# Patient Record
Sex: Male | Born: 1965 | Race: White | Hispanic: No | Smoking: Current every day smoker
Health system: Southern US, Community
[De-identification: ages and names within clinical notes are randomized; demographics above are authoritative.]

## PROBLEM LIST (undated history)

## (undated) DIAGNOSIS — R972 Elevated prostate specific antigen [PSA]: Secondary | ICD-10-CM

## (undated) DIAGNOSIS — I10 Essential (primary) hypertension: Secondary | ICD-10-CM

## (undated) DIAGNOSIS — C61 Malignant neoplasm of prostate: Secondary | ICD-10-CM

## (undated) DIAGNOSIS — Z8042 Family history of malignant neoplasm of prostate: Secondary | ICD-10-CM

## (undated) DIAGNOSIS — N2889 Other specified disorders of kidney and ureter: Secondary | ICD-10-CM

## (undated) DIAGNOSIS — E785 Hyperlipidemia, unspecified: Secondary | ICD-10-CM

## (undated) DIAGNOSIS — F419 Anxiety disorder, unspecified: Secondary | ICD-10-CM

## (undated) HISTORY — PX: PROSTATE BIOPSY: SHX241

## (undated) HISTORY — DX: Family history of malignant neoplasm of prostate: Z80.42

---
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
## 2015-03-14 DIAGNOSIS — C61 Malignant neoplasm of prostate: Secondary | ICD-10-CM

## 2015-03-14 HISTORY — DX: Malignant neoplasm of prostate: C61

## 2015-06-21 ENCOUNTER — Other Ambulatory Visit (HOSPITAL_COMMUNITY): Payer: Self-pay | Admitting: Urology

## 2015-06-21 ENCOUNTER — Telehealth: Payer: Self-pay | Admitting: Medical Oncology

## 2015-06-21 DIAGNOSIS — C61 Malignant neoplasm of prostate: Secondary | ICD-10-CM

## 2015-06-21 NOTE — Telephone Encounter (Signed)
Oncology Nurse Navigator Documentation  Oncology Nurse Navigator Flowsheets 06/21/2015  Navigator Location [facility]  Navigator Encounter Type Introductory phone call  Confirmed Diagnosis Date (No Data)  Acuity Level 1  Acuity Level 1 Initial guidance, education and coordination as needed  Time Spent with Patient 15   I called pt to introduce myself as the Prostate Nurse Navigator and the Coordinator of the [facility]state [facility].  1. I confirmed with the patient he is aware of his referral to the clinic April 14 th arriving 7:30am.   2. I discussed the format of the clinic and the physicians he will be seeing that day.  3. I discussed where the clinic is located and how to contact me.  4. I confirmed his address and informed him I would be mailing a packet of information and forms to be completed. He asked if it is possible to scan and e-mail the documents due to his mail being slow. I asked him to bring them with him the day of his appointment.   He voiced understanding of the above. I asked him to call me if he has any questions or concerns regarding his appointments or the forms he needs to complete.

## 2015-06-23 ENCOUNTER — Ambulatory Visit (HOSPITAL_COMMUNITY)
Admission: RE | Admit: 2015-06-23 | Discharge: 2015-06-23 | Disposition: A | Discharge status: Home / Self Care | Payer: PRIVATE HEALTH INSURANCE | Source: Ambulatory Visit | Attending: Urology | Admitting: Urology

## 2015-06-23 ENCOUNTER — Encounter (HOSPITAL_COMMUNITY)
Admission: RE | Admit: 2015-06-23 | Discharge: 2015-06-23 | Disposition: A | Discharge status: Home / Self Care | Payer: PRIVATE HEALTH INSURANCE | Source: Ambulatory Visit | Attending: Urology | Admitting: Urology

## 2015-06-23 ENCOUNTER — Encounter: Payer: Self-pay | Admitting: Medical Oncology

## 2015-06-23 ENCOUNTER — Encounter: Payer: Self-pay | Admitting: Radiation Oncology

## 2015-06-23 DIAGNOSIS — C61 Malignant neoplasm of prostate: Secondary | ICD-10-CM

## 2015-06-23 DIAGNOSIS — R938 Abnormal findings on diagnostic imaging of other specified body structures: Secondary | ICD-10-CM | POA: Insufficient documentation

## 2015-06-23 MED ORDER — TECHNETIUM TC 99M MEDRONATE IV KIT
24.5000 | PACK | Freq: Once | INTRAVENOUS | Status: AC | PRN
  Administered 2015-06-23: 24.5 via INTRAVENOUS

## 2015-06-23 NOTE — Progress Notes (Signed)
GU Location of Tumor / Histology: prostatic adenocarcinoma  If Prostate Cancer, Gleason Score is (4 + 4) and PSA is (13.4) on 04/13/2015  Dierdre Forth was referred to Dr. Redmond Pulling by Maryjean Ka, PA-C after a rising PSA was noted. Patient sought a second opinion from Dr. Alinda Money.   03/17/15  PSA  11.2 03/23/15 PSA  11.5 04/13/15 PSA  13.4   Biopsies of prostate (if applicable) revealed:    Past/Anticipated interventions by urology, if any: prostate biopsy, referral to [facility], sent for CT of abd/pelvis and bone scan  Past/Anticipated interventions by medical oncology, if any: no  Weight changes, if any: no  Bowel/Bladder complaints, if any: none mentioned in notes obtained from [facility]   Nausea/Vomiting, if any: no  Pain issues, if any:  no  SAFETY ISSUES:  Prior radiation? no  Pacemaker/ICD? no  Possible current pregnancy? no  Is the patient on methotrexate? no  Current Complaints / other details:  50 year old male. Single. Father and paternal uncle with prostate ca. Prostate volume 33.51 cc.

## 2015-06-24 ENCOUNTER — Encounter: Payer: Self-pay | Admitting: Medical Oncology

## 2015-06-25 ENCOUNTER — Ambulatory Visit
Admission: RE | Admit: 2015-06-25 | Discharge: 2015-06-25 | Disposition: A | Discharge status: Home / Self Care | Payer: PRIVATE HEALTH INSURANCE | Source: Ambulatory Visit | Attending: Radiation Oncology | Admitting: Radiation Oncology

## 2015-06-25 ENCOUNTER — Ambulatory Visit (HOSPITAL_BASED_OUTPATIENT_CLINIC_OR_DEPARTMENT_OTHER): Payer: PRIVATE HEALTH INSURANCE | Admitting: Oncology

## 2015-06-25 ENCOUNTER — Encounter: Payer: Self-pay | Admitting: Medical Oncology

## 2015-06-25 VITALS — BP 139/79 | HR 79 | Resp 16 | Ht 70.0 in | Wt 230.0 lb

## 2015-06-25 DIAGNOSIS — Z8042 Family history of malignant neoplasm of prostate: Secondary | ICD-10-CM | POA: Insufficient documentation

## 2015-06-25 DIAGNOSIS — Z7982 Long term (current) use of aspirin: Secondary | ICD-10-CM | POA: Diagnosis not present

## 2015-06-25 DIAGNOSIS — Z9889 Other specified postprocedural states: Secondary | ICD-10-CM | POA: Diagnosis not present

## 2015-06-25 DIAGNOSIS — F419 Anxiety disorder, unspecified: Secondary | ICD-10-CM | POA: Insufficient documentation

## 2015-06-25 DIAGNOSIS — C61 Malignant neoplasm of prostate: Secondary | ICD-10-CM | POA: Insufficient documentation

## 2015-06-25 DIAGNOSIS — I10 Essential (primary) hypertension: Secondary | ICD-10-CM | POA: Diagnosis not present

## 2015-06-25 DIAGNOSIS — Z79899 Other long term (current) drug therapy: Secondary | ICD-10-CM | POA: Diagnosis not present

## 2015-06-25 DIAGNOSIS — F17210 Nicotine dependence, cigarettes, uncomplicated: Secondary | ICD-10-CM | POA: Diagnosis not present

## 2015-06-25 DIAGNOSIS — Z8546 Personal history of malignant neoplasm of prostate: Secondary | ICD-10-CM | POA: Insufficient documentation

## 2015-06-25 DIAGNOSIS — E785 Hyperlipidemia, unspecified: Secondary | ICD-10-CM | POA: Insufficient documentation

## 2015-06-25 HISTORY — DX: Essential (primary) hypertension: I10

## 2015-06-25 HISTORY — DX: Anxiety disorder, unspecified: F41.9

## 2015-06-25 HISTORY — DX: Hyperlipidemia, unspecified: E78.5

## 2015-06-25 HISTORY — DX: Malignant neoplasm of prostate: C61

## 2015-06-25 NOTE — Progress Notes (Signed)
Reason for Referral: Prostate cancer.   HPI: 50 year old gentleman with very limited comorbid conditions including anxiety, hyperlipidemia and hypertension. He does have a significant family history of prostate cancer including his father as well as his uncle and grandfather. Because of his family history, he underwent a PSA screening and in January 2017 was elevated to 11.2. Repeat PSA was 13 and was referred to Dr. Alinda Money for an evaluation. He underwent a biopsy on 06/17/2015 which confirmed the presence of Gleason score 4+3 = 7 and 4 cores and a 4+4 = 8 in 1 core. He had high volume disease in the scores indicating high risk prostate cancer. Staging workup including CT scan and bone scan did not show any evidence of metastatic disease. Clinically, he is asymptomatic from his disease. He denied any urinary symptoms. He denied any dysuria, hematuria or nocturia. His very satisfied with his urine function. He continues to be very active and work full time.  He does not report any headaches, blurry vision, syncope or seizures. He does not report any fevers, chills or sweats. Does not report any cough, wheezing or hemoptysis. Does not report any nausea, vomiting or abdominal pain. Does not report any frequency, urgency or hesitancy. Does not report any skeletal complaints. Remaining review of systems unremarkable.   Past Medical History  Diagnosis Date  . Prostate cancer ([place])   . Anxiety   . Hypertension   . Hyperlipidemia   :  Past Surgical History  Procedure Laterality Date  . Inguinal hernia repair    . Prostate biopsy    :   Current outpatient prescriptions:  .  aspirin 81 MG tablet, Take 81 mg by mouth daily., Disp: , Rfl:  .  atorvastatin (LIPITOR) 10 MG tablet, Take 10 mg by mouth daily., Disp: , Rfl:  .  diphenhydrAMINE (BENADRYL) 25 MG tablet, Take 25 mg by mouth every 6 (six) hours as needed., Disp: , Rfl:  .  EPINEPHrine (EPIPEN IJ), Inject as directed., Disp: , Rfl:  .   LORazepam (ATIVAN) 1 MG tablet, Take 1 mg by mouth every 8 (eight) hours., Disp: , Rfl: :  No Known Allergies:  Family History  Problem Relation Age of Onset  . Cancer Father     prostate  . Cancer Paternal Uncle     prostate cancer  :  Social History   Social History  . Marital Status: Widowed    Spouse Name: N/A  . Number of Children: N/A  . Years of Education: N/A   Occupational History  . Not on file.   Social History Main Topics  . Smoking status: Current Every Day Smoker    Types: Cigarettes  . Smokeless tobacco: Never Used  . Alcohol Use: Yes  . Drug Use: No  . Sexual Activity: Not on file   Other Topics Concern  . Not on file   Social History Narrative  . No narrative on file  :  Pertinent items are noted in HPI.  Exam:  General appearance: alert and cooperative appeared without distress. Head: Normocephalic, without obvious abnormality Throat: lips, mucosa, and tongue normal; teeth and gums normal Neck: no adenopathy Back: negative Resp: clear to auscultation bilaterally no dullness to percussion. Chest wall: no tenderness Cardio: regular rate and rhythm, S1, S2 normal, no murmur, click, rub or gallop GI: soft, non-tender; bowel sounds normal; no masses,  no organomegaly Extremities: extremities normal, atraumatic, no cyanosis or edema Pulses: 2+ and symmetric Skin: Skin color, texture, turgor normal. No rashes  or lesions    Nm Bone Scan Whole Body  06/23/2015  CLINICAL DATA:  Prostate malignancy, PSA of 11.21 on March 17, 2015, no current skeletal symptoms. History previous right wrist and clavicular fractures also finger and toe fractures in the past. EXAM: NUCLEAR MEDICINE WHOLE BODY BONE SCAN TECHNIQUE: Whole body anterior and posterior images were obtained approximately 3 hours after intravenous injection of radiopharmaceutical. RADIOPHARMACEUTICALS:  24.5 mCi Technetium-72m MDP IV COMPARISON:  None in PACs FINDINGS: There is adequate uptake of  the radiopharmaceutical by the skeleton. Adequate soft tissue clearance and renal activity is observed. Uptake over the calvarium, spine, ribs, sternum, and pelvis is normal. Uptake over the visualized portions of the upper extremities is normal. There is mildly increased uptake associated with both ankles greatest on the left. IMPRESSION: There are no findings suspicious for metastatic prostate malignancy to the bones. Degenerative type uptake is noted associated with the ankles. Electronically Signed   By: David  Martinique M.D.   On: 06/23/2015 14:06    Assessment and Plan:   50 year old gentleman with prostate cancer diagnosed in April 2017. His PSA up to 13 and a Gleason score 4+4 = 8. He has high volume disease with high risk features. His staging workup is unremarkable for metastatic disease. He has very little urinary symptoms at this time.  His case was discussed today in the multidisciplinary prostate cancer clinic and his pathology specimen was discussed with the reviewing pathologist. His imaging studies were also reviewed by radiology. Clearly he has a high risk cancer that requires definitive therapy. His options would include surgical therapy versus radiation therapy and androgen deprivation for at least 2 years. Given his young age and aggressive nature of his cancer, who would be reasonable to consider primary surgical therapy as the treatment modality of choice. He is agreeable to proceed with this option at this time.  We have discussed the implication of his strong family history of prostate cancer. His case was also discussed with genetic counselor who felt he would benefit from counseling and he is agreeable to it. He has 3 children including son and 2 daughters and the implication of genetic testing might impact there future cancer screening strategies. We will set that up for him as well given his agreement at this time.  I see no role for other systemic therapy at this time such as  chemotherapy, immunotherapy unless he develops advanced disease.

## 2015-06-25 NOTE — Progress Notes (Signed)
Radiation Oncology         [phone] ________________________________  Multidisciplinary Prostate Cancer Clinic  Initial Radiation Oncology Consultation  Name: Marc Ellis MRN: [id number]  Date: 06/25/2015  DOB: 05-04-1965  [id number], MD  Raynelle Bring, MD   REFERRING PHYSICIAN: Raynelle Bring, MD  DIAGNOSIS: 50 y.o. gentleman with stage T1c adenocarcinoma of the prostate with a Gleason's score of 4+4 and a PSA of 13.4    ICD-9-CM ICD-10-CM   1. 50 yo man with stage T2a adenocarcinoma of the prostate with a Gleason's score of 4+4 and a PSA of 13.4 - High Risk [place] E Kozar is a 50 y.o. gentleman.  He was noted to have an elevated PSA of 13.4 by his PCP, Tacy Dura, PA-C.  Accordingly, he was referred for evaluation in urology by Dr. Alinda Money on 06/09/15,  digital rectal examination was performed at that time revealing no nodules.  The patient proceeded to transrectal ultrasound with 12 biopsies of the prostate on 06/17/15.  The prostate volume measured 33.51 cc.  Out of 12 core biopsies, 5 were positive.  The maximum Gleason score was 4+4, and this was seen in the left mid gland.  Bone san and pelvic CT on 06/23/15 showed no findings of metastatic disease.  The patient reviewed the biopsy results with his urologist and he has kindly been referred today to the multidisciplinary prostate cancer clinic for presentation of pathology and radiology studies in our conference for discussion of potential radiation treatment options and clinical evaluation.  PREVIOUS RADIATION THERAPY: No  PAST MEDICAL HISTORY:  has a past medical history of Prostate cancer ([place]); Anxiety; Hypertension; and Hyperlipidemia.    PAST SURGICAL HISTORY: Past Surgical History  Procedure Laterality Date  . Inguinal hernia repair    . Prostate biopsy      FAMILY HISTORY: family history includes Cancer in his father and paternal uncle.  SOCIAL HISTORY:   reports that he has been smoking Cigarettes.  He has never used smokeless tobacco. He reports that he drinks alcohol. He reports that he does not use illicit drugs.  ALLERGIES: Review of patient's allergies indicates no known allergies.  MEDICATIONS:  Current Outpatient Prescriptions  Medication Sig Dispense Refill  . aspirin 81 MG tablet Take 81 mg by mouth daily.    Marland Kitchen atorvastatin (LIPITOR) 10 MG tablet Take 10 mg by mouth daily.    . diphenhydrAMINE (BENADRYL) 25 MG tablet Take 25 mg by mouth every 6 (six) hours as needed.    Marland Kitchen EPINEPHrine (EPIPEN IJ) Inject as directed.    Marland Kitchen LORazepam (ATIVAN) 1 MG tablet Take 1 mg by mouth every 8 (eight) hours.     No current facility-administered medications for this encounter.    REVIEW OF SYSTEMS:  A 15 point review of systems is documented in the electronic medical record. This was obtained by the nursing staff. However, I reviewed this with the patient to discuss relevant findings and make appropriate changes.  Pertinent items are noted in HPI..  The patient completed an IPSS and IIEF questionnaire.  His IPSS score was 4 indicating mild urinary outflow obstructive symptoms.  He indicated that his erectile function is able to complete sexual activity on most attempts.   PHYSICAL EXAM: This patient is in no acute distress.  He is alert and oriented.   height is 5\' 10"  (1.778 m) and weight is 230 lb (104.327 kg). His blood pressure is 139/79 and his pulse is  79. His respiration is 16 and oxygen saturation is 100%.  He exhibits no respiratory distress or labored breathing.  He appears neurologically intact.  His mood is pleasant.  His affect is appropriate.  Please note the digital rectal exam findings described above.  KPS = 100  100 - Normal; no complaints; no evidence of disease. 90   - Able to carry on normal activity; minor signs or symptoms of disease. 80   - Normal activity with effort; some signs or symptoms of disease. 66   - Cares for self;  unable to carry on normal activity or to do active work. 60   - Requires occasional assistance, but is able to care for most of his personal needs. 50   - Requires considerable assistance and frequent medical care. 88   - Disabled; requires special care and assistance. 24   - Severely disabled; hospital admission is indicated although death not imminent. 36   - Very sick; hospital admission necessary; active supportive treatment necessary. 10   - Moribund; fatal processes progressing rapidly. 0     - Dead  Karnofsky DA, Abelmann [place], Craver LS and Burchenal JH [phone]) The use of the nitrogen mustards in the palliative treatment of carcinoma: with particular reference to bronchogenic carcinoma Cancer 1 [phone]   LABORATORY DATA:  No results found for: WBC, HGB, HCT, MCV, PLT No results found for: NA, K, CL, CO2 No results found for: ALT, AST, GGT, ALKPHOS, BILITOT   RADIOGRAPHY: Nm Bone Scan Whole Body  06/23/2015  CLINICAL DATA:  Prostate malignancy, PSA of 11.21 on March 17, 2015, no current skeletal symptoms. History previous right wrist and clavicular fractures also finger and toe fractures in the past. EXAM: NUCLEAR MEDICINE WHOLE BODY BONE SCAN TECHNIQUE: Whole body anterior and posterior images were obtained approximately 3 hours after intravenous injection of radiopharmaceutical. RADIOPHARMACEUTICALS:  24.5 mCi Technetium-69m MDP IV COMPARISON:  None in PACs FINDINGS: There is adequate uptake of the radiopharmaceutical by the skeleton. Adequate soft tissue clearance and renal activity is observed. Uptake over the calvarium, spine, ribs, sternum, and pelvis is normal. Uptake over the visualized portions of the upper extremities is normal. There is mildly increased uptake associated with both ankles greatest on the left. IMPRESSION: There are no findings suspicious for metastatic prostate malignancy to the bones. Degenerative type uptake is noted associated with the ankles. Electronically Signed    By: David  Martinique M.D.   On: 06/23/2015 14:06      IMPRESSION: This gentleman is a 50 y.o. gentleman with stage T1c adenocarcinoma of the prostate with a Gleason's score of 4+4 and a PSA of 13.4.  His T-Stage, Gleason's Score, and PSA put him into the high risk group.  Accordingly he is eligible for a variety of potential treatment options including prostatectomy vs. androgen depravation for two years with radiation.  PLAN: Today I reviewed the findings and workup thus far.  We discussed the natural history of prostate cancer.  We reviewed the the implications of T-stage, Gleason's Score, and PSA on decision-making and outcomes related to prostate cancer.  We discussed radiation treatment in the management of prostate cancer with regard to the logistics and delivery of external beam radiation treatment as well as the logistics and delivery of prostate brachytherapy.  We compared and contrasted each of these approaches and also compared these against prostatectomy.    The patient focused most of his questions and interest in robotic-assisted laparoscopic radical prostatectomy.  We discussed some of the  potential advantages of surgery including surgical staging, the availability of salvage radiotherapy to the prostatic fossa, and the confidence associated with immediate biochemical response.  We discussed some of the potential proven indications for postoperative radiotherapy including positive margins, extracapsular extension, and seminal vesicle involvement. We also talked about some of the other potential findings leading to a recommendation for radiotherapy including a non-zero postoperative PSA and positive lymph nodes.   The patient is leaning towards prostatectomy. I enjoyed meeting with him today, and will look forward to participating in the care of this very nice gentleman.  I will look forward to following his progress  I spent 40 minutes face to face with the patient and more than 50% of  that time was spent in counseling and/or coordination of care.   ------------------------------------------------  Sheral Apley. Tammi Klippel, M.D.  This document serves as a record of services personally performed by Tyler Pita, MD. It was created on his behalf by Darcus Austin, a trained medical scribe. The creation of this record is based on the scribe's personal observations and the provider's statements to them. This document has been checked and approved by the attending provider.

## 2015-06-25 NOTE — Consult Note (Signed)
Chief Complaint  Prostate Cancer   Reason For Visit  Reason for consult: To discuss treatment options for high risk prostate cancer. PCP: Marc Ka, PA-C Location of consult: [facility]   History of Present Illness  Marc [facility] is a 50 year old gentleman with a history of hyperlipidemia and hypertension.  He has a family history of prostate cancer and I had performed a robotic prostatectomy on his father a few years ago.  He was noted to have a PSA of 11.2 in early January 2017 which was repeated at remained elevated at 11.5.  He was sent for urologic consultation with Dr. Harrison [facility] with [facility].  He was recommended to undergo a biopsy but did not have good rapport with Dr. Redmond Ellis and so did not proceed.  They agreed with empiric antibiotic therapy for possible prostatitis although he was asymptomatic.  A repeat PSA after antibiotic therapy with ciprofloxacin was 13.4 on 04/13/15.  He was seen by me in late March for a 2nd opinion and his exam demonstrated some asymmetry of the prostate with the left more pronounced but without clear induration or nodularity.  He did proceed with a TRUS biopsy of the prostate on 06/17/15 confirming Gleason 4+4=8 adenocarcinoma of the prostate with 5 out of 12 biopsy cores positive for malignancy (all left sided).  He did undergo staging studies with a CT scan of the pelvis and bone scan on 06/24/15.  These studies were negative for measurable metastatic disease.  TNM stage: cT1c N0 M0 (although left prostate is concerning despite no clear induration or nodularity) PSA: 13.4 Gleason score: 4+4=8 Biopsy (06/17/15): 5/12 cores positive    Left: L lateral apex (60%, 4+3=7), L apex (70%, 4+3=7), L lateral mid (20%, 4+3=7), L mid (70%, 4+4=8), L lateral base (90%, 4+3=7)    Right: Benign Prostate volume: 33.5 cc  Nomogram OC disease: 18% EPE: 79% SVI: 23% LNI: 24% PFS (surgery): 32% at 5 years, 20% at 10  years  Urinary function: IPSS is 4. Erectile function: SHIM score is 19.  Interval history:  He is seen today at the prostate cancer [facility] after his staging studies were performed earlier this week.  He was informed of his cancer diagnosis on the telephone and discussed potential options and he expressed most interested in surgical therapy assuming that he did have organ confined disease.  He is here today with his girlfriend.   Past Medical History  1. History of Anxiety (F41.9)  2. History of hyperlipidemia (Z86.39)  3. History of hypertension (Z86.79)  Surgical History  1. History of Inguinal Hernia Repair  Current Meds  1. Aspirin 81 MG TABS;  Therapy: (Recorded:28Mar2017) to Recorded  2. Ativan 1 MG Oral Tablet;  Therapy: (Recorded:28Mar2017) to Recorded  3. Benadryl 25 MG CAPS;  Therapy: (Recorded:28Mar2017) to Recorded  4. EpiPen 0.3 MG/0.3ML DEVI;  Therapy: (Recorded:28Mar2017) to Recorded  5. LevoFLOXacin 500 MG Oral Tablet; 1 tablet the day before procedure, 1 tablet day of  procedure, and 1 tablet day after procedure;  Therapy: [id number] to (Last Rx:29Mar2017)  Requested for: 29Mar2017 Ordered  6. Lipitor 10 MG Oral Tablet;  Therapy: (Recorded:28Mar2017) to Recorded  Allergies  1. No Known Drug Allergies  Family History  1. Family history of diabetes mellitus (Z83.3) : Mother  2. Family history of hypertension (Z82.49) : Father  3. Family history of prostate cancer (Z80.42) : Father, Paternal Uncle  Social History   Alcohol use (Z78.9)  Current every day smoker (F17.200)   Single  Physical Exam Constitutional: Well nourished and well developed . No acute distress.  ENT:. The ears and nose are normal in appearance.  Neck: The appearance of the neck is normal and no neck mass is present.  Pulmonary: No respiratory distress, normal respiratory rhythm and effort and clear bilateral breath sounds.  Cardiovascular: Heart rate and rhythm  are normal . No peripheral edema.  Abdomen: The abdomen is soft and nontender. No masses are palpated. No CVA tenderness. No hernias are palpable. No hepatosplenomegaly noted.  Lymphatics: The femoral and inguinal nodes are not enlarged or tender.  Skin: Normal skin turgor, no visible rash and no visible skin lesions.  Neuro/Psych:. Mood and affect are appropriate.    Results/Data  I have independently reviewed his medical records, PSA results, and pathology slides.  I have independently reviewed his bone scan and CT scan of the pelvis.  Findings are as dictated above.     Assessment  1. Prostate cancer (C61)  Plan  1. PT/OT Referral Referral  Referral  Status: Hold For - Appointment,PreCert,Date of  Service,Physical Therapy  Requested for: 11Apr2017  Discussion/Summary  1.  Prostate cancer: I had a detailed discussion with Marc [facility] and his girlfriend today.  We reviewed his staging studies which fortunately do not indicate evidence of metastatic disease.  He understands the recommendation proceed with curative therapy and based on his young age, he is most interested in a primary surgical approach although understands the potential necessity for postoperative adjuvant or salvage therapy considering her high-risk nature of his disease.   The patient was counseled about the natural history of prostate cancer and the standard treatment options that are available for prostate cancer. It was explained to him how his age and life expectancy, clinical stage, Gleason score, and PSA affect his prognosis, the decision to proceed with additional staging studies, as well as how that information influences recommended treatment strategies. We discussed the roles for active surveillance, radiation therapy, surgical therapy, androgen deprivation, as well as ablative therapy options for the treatment of prostate cancer as appropriate to his individual cancer situation. We discussed the risks and benefits of these  options with regard to their impact on cancer control and also in terms of potential adverse events, complications, and impact on quiality of life particularly related to urinary, bowel, and sexual function. The patient was encouraged to ask questions throughout the discussion today and all questions were answered to his stated satisfaction. In addition, the patient was provided with and/or directed to appropriate resources and literature for further education about prostate cancer and treatment options.   We discussed surgical therapy for prostate cancer including the different available surgical approaches. We discussed, in detail, the risks and expectations of surgery with regard to cancer control, urinary control, and erectile function as well as the expected postoperative recovery process. Additional risks of surgery including but not limited to bleeding, infection, hernia formation, nerve damage, lymphocele formation, bowel/rectal injury potentially necessitating colostomy, damage to the urinary tract resulting in urine leakage, urethral stricture, and the cardiopulmonary risks such as myocardial infarction, stroke, death, venothromboembolism, etc. were explained. The risk of open surgical conversion for robotic/laparoscopic prostatectomy was also discussed.   After discussion, he does wish to proceed with primary surgical therapy.  We specifically discussed the concern about the volume of disease located on the left side of the prostate.  He is certainly very concerned about his erectile function at age 53 although understands  the importance of his high-grade malignancy and appropriate treatment for it.  We have agreed to proceed with a preoperative endorectal MRI of the prostate for surgical planning purposes.  If his MRI does not indicate clear extraprostatic extension,  we have discussed proceeding with a partial left nerve sparing and full right nerve sparing robot-assisted laparoscopic radical  prostatectomy and pelvic lymphadenectomy.  If he does have clear evidence of extraprostatic extension, he understands that we would proceed with a wide non-nerve sparing approach on the left.  We discussed the potential implications with regard to these approaches as far as his outcomes related to erectile function.  He expresses understanding and has appropriate expectations.  Furthermore, he was seen by genetic counseling today.  Ms. Roma Kayser did review the potential benefits of genetic screening considering his 3 children.  Based on his strong family history of prostate cancer including his father and brother who have both been diagnosed with prostate cancer and considering the fact that he could potentially have genetic abnormality such as BRCA2 that may benefit his children, he is going to proceed.  This will be scheduled through the cancer center.   A total of 55 minutes were spent in the overall care of the patient today with 45 minutes in direct face to face consultation.   Cc: Marc Ka, PA-C    Amendment  CC: Dr. Zola Button Dr. Tyler Pita   Signatures Electronically signed by : Raynelle Bring, M.D.; Jun 25 2015 12:56PM EST Electronically signed by : Raynelle Bring, M.D.; Jun 25 2015  1:14PM EST

## 2015-06-25 NOTE — Progress Notes (Signed)
                               Care Plan Summary  Name: Marc [facility] DOB: [birth year]   Your Medical Team:   Urologist -  Dr. Raynelle Bring, [facility]  Radiation Oncologist - Dr.Matthew Micki [facility]   Medical Oncologist - Dr. Zola Button, [name]  Recommendations: 1)  Genetic Studies 2)  Robotic Prostatectomy  3)  Radiation  * These recommendations are based on information available as of today's consult.      Recommendations may change depending on the results of further tests or exams.    Next Steps: 1) Dr. Alinda Money will schedule you for surgery  2) Dr. Alen Blew will make referral  for genetics   When appointments need to be scheduled, you will be contacted by [facility] and/or [facility].  Questions?  Please do not hesitate to call Cira Rue, RN, BSN, OCN  at [phone] any questions or concerns.  Shirlean Mylar is your Oncology Nurse Navigator and is available to assist you while you're receiving your medical care at [facility].

## 2015-06-28 ENCOUNTER — Encounter: Payer: Self-pay | Admitting: Medical Oncology

## 2015-06-28 ENCOUNTER — Other Ambulatory Visit: Payer: Self-pay | Admitting: Urology

## 2015-06-28 ENCOUNTER — Other Ambulatory Visit: Payer: Self-pay | Admitting: Medical Oncology

## 2015-06-28 DIAGNOSIS — C61 Malignant neoplasm of prostate: Secondary | ICD-10-CM

## 2015-06-29 ENCOUNTER — Encounter: Payer: Self-pay | Admitting: General Practice

## 2015-06-29 ENCOUNTER — Other Ambulatory Visit (HOSPITAL_COMMUNITY): Payer: Self-pay | Admitting: Urology

## 2015-06-29 DIAGNOSIS — C61 Malignant neoplasm of prostate: Secondary | ICD-10-CM

## 2015-06-29 NOTE — Progress Notes (Signed)
[facility] Psychosocial Distress Screening Spiritual Care  Spiritual Care was referred by distress screening protocol.  The patient scored a 7 on the Psychosocial Distress Thermometer which indicates severe distress. Marc [facility] was seen by counseling intern Wendall Papa in [facility] on 06/25/15 to assess for distress and other psychosocial needs.   ONCBCN DISTRESS SCREENING 06/29/2015  Screening Type Initial Screening  Distress experienced in past week (1-10) 7  Emotional problem type Nervousness/Anxiety;Adjusting to illness  Physical Problem type Sleep/insomnia  Referral to support programs Yes  Other Spiritual Care, counseling intern   Wendall Papa provided emotional support and information about [facility] team/resources for additional care as [facility] processed his concerns about quality of life, being out of work, and anxiety about how procedure may affect personal and professional life.    Follow up needed: Yes.  Pt is aware of ongoing Support Team availability and contact information.  Chaplain will call to offer f/u support.  Please also page as needs arise/circumstances change.  Thank you.  [place], [place], [facility] Pager [phone] Voicemail  [phone]

## 2015-07-01 ENCOUNTER — Encounter: Payer: Self-pay | Admitting: General Practice

## 2015-07-01 NOTE — Progress Notes (Signed)
Spiritual Care Note  Reached Mr Vogelpohl by phone to offer [place] care and plan to f/u by phone at more convenient time.  Chaplain Lorrin Jackson, MDiv, [facility]

## 2015-07-06 ENCOUNTER — Ambulatory Visit: Payer: Self-pay | Admitting: Oncology

## 2015-07-12 ENCOUNTER — Telehealth: Payer: Self-pay | Admitting: Genetic Counselor

## 2015-07-12 ENCOUNTER — Encounter: Payer: Self-pay | Admitting: Genetic Counselor

## 2015-07-12 NOTE — Telephone Encounter (Signed)
Verified address and insurance, completed intake, mailed new pt packet

## 2015-07-26 ENCOUNTER — Ambulatory Visit (HOSPITAL_COMMUNITY)
Admission: RE | Admit: 2015-07-26 | Discharge: 2015-07-26 | Disposition: A | Discharge status: Home / Self Care | Payer: PRIVATE HEALTH INSURANCE | Source: Ambulatory Visit | Attending: Urology | Admitting: Urology

## 2015-07-26 ENCOUNTER — Other Ambulatory Visit (HOSPITAL_COMMUNITY): Payer: Self-pay | Admitting: Urology

## 2015-07-26 DIAGNOSIS — Z9889 Other specified postprocedural states: Secondary | ICD-10-CM | POA: Diagnosis not present

## 2015-07-26 DIAGNOSIS — C61 Malignant neoplasm of prostate: Secondary | ICD-10-CM | POA: Insufficient documentation

## 2015-07-26 MED ORDER — GADOBENATE DIMEGLUMINE 529 MG/ML IV SOLN
20.0000 mL | Freq: Once | INTRAVENOUS | Status: AC | PRN
  Administered 2015-07-26: 20 mL via INTRAVENOUS

## 2015-07-28 ENCOUNTER — Other Ambulatory Visit (HOSPITAL_COMMUNITY): Payer: Self-pay | Admitting: *Deleted

## 2015-07-28 NOTE — Patient Instructions (Addendum)
Marc [facility]  07/28/2015   Your procedure is scheduled on: 08-02-15  Report to [facility]  Entrance take [facility]  elevators to 3rd floor to  [name] at 515 AM.  Call this number if you have problems the morning of surgery [phone]   Remember: ONLY 1 PERSON MAY GO WITH YOU TO SHORT STAY TO GET  READY MORNING OF [place].  Do not eat food :After Midnight, SATURDAY NIGHT CLEAR LIQUIDS ALL DAY SUNDAY 08-01-15 PER DR BORDEN'S INSTRUCTIONS, NO CLEAR LIQUIDS AFTER MIDNIGHT SUNDAY NIGHT. FOLLLOW ALL BOWEL PREP INSTRUCTIONS FROM DR Alinda Money     Take these medicines the morning of surgery with A SIP OF WATER: LORAZEPAM (ATIVAN )IF NEEDED, TYLENOL IF NEEDED             You may not have any metal on your body including hair pins and              piercings  Do not wear jewelry, make-up, lotions, powders or perfumes, deodorant             Do not wear nail polish.  Do not shave  48 hours prior to surgery.              Men may shave face and neck.   Do not bring valuables to the hospital. [name].  Contacts, dentures or bridgework may not be worn into surgery.  Leave suitcase in the car. After surgery it may be brought to your room.                Please read over the following fact sheets you were given: _____________________________________________________________________                CLEAR LIQUID DIET   Foods Allowed                                                                     Foods Excluded  Coffee and tea, regular and decaf                             liquids that you cannot  Plain Jell-O in any flavor                                             see through such as: Fruit ices (not with fruit pulp)                                     milk, soups, orange juice  Iced Popsicles                                    All solid food Carbonated beverages, regular and diet  Cranberry, grape and apple juices Sports drinks like Gatorade Lightly seasoned clear broth or consume(fat free) Sugar, honey syrup  Sample Menu Breakfast                                Lunch                                     Supper Cranberry juice                    Beef broth                            Chicken broth Jell-O                                     Grape juice                           Apple juice Coffee or tea                        Jell-O                                      Popsicle                                                Coffee or tea                        Coffee or tea  _____________________________________________________________________  [facility] - Preparing for Surgery Before surgery, you can play an important role.  Because skin is not sterile, your skin needs to be as free of germs as possible.  You can reduce the number of germs on your skin by washing with CHG (chlorahexidine gluconate) soap before surgery.  CHG is an antiseptic cleaner which kills germs and bonds with the skin to continue killing germs even after washing. Please DO NOT use if you have an allergy to CHG or antibacterial soaps.  If your skin becomes reddened/irritated stop using the CHG and inform your nurse when you arrive at Short Stay. Do not shave (including legs and underarms) for at least 48 hours prior to the first CHG shower.  You may shave your face/neck. Please follow these instructions carefully:  1.  Shower with CHG Soap the night before surgery and the  morning of Surgery.  2.  If you choose to wash your hair, wash your hair first as usual with your  normal  shampoo.  3.  After you shampoo, rinse your hair and body thoroughly to remove the  shampoo.                           4.  Use CHG as you would any other liquid soap.  You can apply chg directly  to the skin and wash  Gently with a scrungie or clean washcloth.  5.  Apply the CHG Soap to your  body ONLY FROM THE NECK DOWN.   Do not use on face/ open                           Wound or open sores. Avoid contact with eyes, ears mouth and genitals (private parts).                       Wash face,  Genitals (private parts) with your normal soap.             6.  Wash thoroughly, paying special attention to the area where your surgery  will be performed.  7.  Thoroughly rinse your body with warm water from the neck down.  8.  DO NOT shower/wash with your normal soap after using and rinsing off  the CHG Soap.                9.  Pat yourself dry with a clean towel.            10.  Wear clean pajamas.            11.  Place clean sheets on your bed the night of your first shower and do not  sleep with pets. Day of Surgery : Do not apply any lotions/deodorants the morning of surgery.  Please wear clean clothes to the hospital/surgery center.  FAILURE TO FOLLOW THESE INSTRUCTIONS MAY RESULT IN THE CANCELLATION OF YOUR SURGERY PATIENT SIGNATURE_________________________________  NURSE SIGNATURE__________________________________  ________________________________________________________________________   [facility]  An incentive spirometer is a tool that can help keep your lungs clear and active. This tool measures how well you are filling your lungs with each breath. Taking long deep breaths may help reverse or decrease the chance of developing breathing (pulmonary) problems (especially infection) following:  A long period of time when you are unable to move or be active. BEFORE THE PROCEDURE   If the spirometer includes an indicator to show your best effort, your nurse or respiratory therapist will set it to a desired goal.  If possible, sit up straight or lean slightly forward. Try not to slouch.  Hold the incentive spirometer in an upright position. INSTRUCTIONS FOR USE   Sit on the edge of your bed if possible, or sit up as far as you can in bed or on a chair.  Hold the  incentive spirometer in an upright position.  Breathe out normally.  Place the mouthpiece in your mouth and seal your lips tightly around it.  Breathe in slowly and as deeply as possible, raising the piston or the ball toward the top of the column.  Hold your breath for 3-5 seconds or for as long as possible. Allow the piston or ball to fall to the bottom of the column.  Remove the mouthpiece from your mouth and breathe out normally.  Rest for a few seconds and repeat Steps 1 through 7 at least 10 times every 1-2 hours when you are awake. Take your time and take a few normal breaths between deep breaths.  The spirometer may include an indicator to show your best effort. Use the indicator as a goal to work toward during each repetition.  After each set of 10 deep breaths, practice coughing to be sure your lungs are clear. If you have an incision (the cut made at the time of  surgery), support your incision when coughing by placing a pillow or rolled up towels firmly against it. Once you are able to get out of bed, walk around indoors and cough well. You may stop using the incentive spirometer when instructed by your caregiver.  RISKS AND COMPLICATIONS  Take your time so you do not get dizzy or light-headed.  If you are in pain, you may need to take or ask for pain medication before doing incentive spirometry. It is harder to take a deep breath if you are having pain. AFTER USE  Rest and breathe slowly and easily.  It can be helpful to keep track of a log of your progress. Your caregiver can provide you with a simple table to help with this. If you are using the spirometer at home, follow these instructions: [place] IF:   You are having difficultly using the spirometer.  You have trouble using the spirometer as often as instructed.  Your pain medication is not giving enough relief while using the spirometer.  You develop fever of 100.5 F (38.1 C) or higher. SEEK  IMMEDIATE MEDICAL CARE IF:   You cough up bloody sputum that had not been present before.  You develop fever of 102 F (38.9 C) or greater.  You develop worsening pain at or near the incision site. MAKE SURE YOU:   Understand these instructions.  Will watch your condition.  Will get help right away if you are not doing well or get worse. Document Released: 07/10/2006 Document Revised: 05/22/2011 Document Reviewed: 09/10/2006 ExitCare Patient Information 2014 ExitCare, [place].   ________________________________________________________________________  WHAT IS A BLOOD TRANSFUSION? Blood Transfusion Information  A transfusion is the replacement of blood or some of its parts. Blood is made up of multiple cells which provide different functions.  Red blood cells carry oxygen and are used for blood loss replacement.  White blood cells fight against infection.  Platelets control bleeding.  Plasma helps clot blood.  Other blood products are available for specialized needs, such as hemophilia or other clotting disorders. BEFORE THE TRANSFUSION  Who gives blood for transfusions?   Healthy volunteers who are fully evaluated to make sure their blood is safe. This is blood bank blood. Transfusion therapy is the safest it has ever been in the practice of medicine. Before blood is taken from a donor, a complete history is taken to make sure that person has no history of diseases nor engages in risky social behavior (examples are intravenous drug use or sexual activity with multiple partners). The donor's travel history is screened to minimize risk of transmitting infections, such as malaria. The donated blood is tested for signs of infectious diseases, such as HIV and hepatitis. The blood is then tested to be sure it is compatible with you in order to minimize the chance of a transfusion reaction. If you or a relative donates blood, this is often done in anticipation of surgery and is not  appropriate for emergency situations. It takes many days to process the donated blood. RISKS AND COMPLICATIONS Although transfusion therapy is very safe and saves many lives, the main dangers of transfusion include:   Getting an infectious disease.  Developing a transfusion reaction. This is an allergic reaction to something in the blood you were given. Every precaution is taken to prevent this. The decision to have a blood transfusion has been considered carefully by your caregiver before blood is given. Blood is not given unless the benefits outweigh the risks. AFTER THE  TRANSFUSION  Right after receiving a blood transfusion, you will usually feel much better and more energetic. This is especially true if your red blood cells have gotten low (anemic). The transfusion raises the level of the red blood cells which carry oxygen, and this usually causes an energy increase.  The nurse administering the transfusion will monitor you carefully for complications. HOME CARE INSTRUCTIONS  No special instructions are needed after a transfusion. You may find your energy is better. Speak with your caregiver about any limitations on activity for underlying diseases you may have. SEEK MEDICAL CARE IF:   Your condition is not improving after your transfusion.  You develop redness or irritation at the intravenous (IV) site. SEEK IMMEDIATE MEDICAL CARE IF:  Any of the following symptoms occur over the next 12 hours:  Shaking chills.  You have a temperature by mouth above 102 F (38.9 C), not controlled by medicine.  Chest, back, or muscle pain.  People around you feel you are not acting correctly or are confused.  Shortness of breath or difficulty breathing.  Dizziness and fainting.  You get a rash or develop hives.  You have a decrease in urine output.  Your urine turns a dark color or changes to pink, red, or brown. Any of the following symptoms occur over the next 10 days:  You have a  temperature by mouth above 102 F (38.9 C), not controlled by medicine.  Shortness of breath.  Weakness after normal activity.  The white part of the eye turns yellow (jaundice).  You have a decrease in the amount of urine or are urinating less often.  Your urine turns a dark color or changes to pink, red, or brown. Document Released: 02/25/2000 Document Revised: 05/22/2011 Document Reviewed: 10/14/2007 [facility] Patient Information 2014 [place], [place].  _______________________________________________________________________

## 2015-07-29 ENCOUNTER — Ambulatory Visit (HOSPITAL_COMMUNITY)
Admission: RE | Admit: 2015-07-29 | Discharge: 2015-07-29 | Disposition: A | Discharge status: Home / Self Care | Payer: PRIVATE HEALTH INSURANCE | Source: Ambulatory Visit | Attending: Urology | Admitting: Urology

## 2015-07-29 ENCOUNTER — Encounter (HOSPITAL_COMMUNITY)
Admission: RE | Admit: 2015-07-29 | Discharge: 2015-07-29 | Disposition: A | Discharge status: Home / Self Care | Payer: PRIVATE HEALTH INSURANCE | Source: Ambulatory Visit | Attending: Urology | Admitting: Urology

## 2015-07-29 ENCOUNTER — Encounter (HOSPITAL_COMMUNITY): Payer: Self-pay

## 2015-07-29 DIAGNOSIS — Z01818 Encounter for other preprocedural examination: Secondary | ICD-10-CM

## 2015-07-29 HISTORY — DX: Elevated prostate specific antigen (PSA): R97.20

## 2015-07-29 LAB — CBC
HCT: 45.7 % (ref 39.0–52.0)
Hemoglobin: 15.3 g/dL (ref 13.0–17.0)
MCH: 30.7 pg (ref 26.0–34.0)
MCHC: 33.5 g/dL (ref 30.0–36.0)
MCV: 91.6 fL (ref 78.0–100.0)
PLATELETS: 271 10*3/uL (ref 150–400)
RBC: 4.99 MIL/uL (ref 4.22–5.81)
RDW: 13.9 % (ref 11.5–15.5)
WBC: 10.4 10*3/uL (ref 4.0–10.5)

## 2015-07-29 LAB — BASIC METABOLIC PANEL
Anion gap: 7 (ref 5–15)
BUN: 19 mg/dL (ref 6–20)
CALCIUM: 9.4 mg/dL (ref 8.9–10.3)
CO2: 24 mmol/L (ref 22–32)
CREATININE: 0.89 mg/dL (ref 0.61–1.24)
Chloride: 108 mmol/L (ref 101–111)
GFR calc Af Amer: >60 mL/min (ref >60)
GFR calc non Af Amer: >60 mL/min (ref >60)
GLUCOSE: 102 mg/dL — AB (ref 65–99)
Potassium: 4.3 mmol/L (ref 3.5–5.1)
Sodium: 139 mmol/L (ref 135–145)

## 2015-07-29 LAB — ABO/RH: ABO/RH(D): O POS

## 2015-08-01 NOTE — H&P (Signed)
Chief Complaint  Prostate Cancer   History of Present Illness  Marc Ellis is a 50 year old gentleman with a history of hyperlipidemia and hypertension.  He has a family history of prostate cancer and I had performed a robotic prostatectomy on his father a few years ago.  He was noted to have a PSA of 11.2 in early January 2017 which was repeated at remained elevated at 11.5.  He was sent for urologic consultation with Dr. Harrison Mons with [facility].  He was recommended to undergo a biopsy but did not have good rapport with Dr. Redmond Pulling and so did not proceed.  They agreed with empiric antibiotic therapy for possible prostatitis although he was asymptomatic.  A repeat PSA after antibiotic therapy with ciprofloxacin was 13.4 on 04/13/15.  He was seen by me in late March for a 2nd opinion and his exam demonstrated some asymmetry of the prostate with the left more pronounced but without clear induration or nodularity.  He did proceed with a TRUS biopsy of the prostate on 06/17/15 confirming Gleason 4+4=8 adenocarcinoma of the prostate with 5 out of 12 biopsy cores positive for malignancy (all left sided).  He did undergo staging studies with a CT scan of the pelvis and bone scan on 06/24/15.  These studies were negative for measurable metastatic disease.  TNM stage: cT1c N0 M0 (although left prostate is concerning despite no clear induration or nodularity) PSA: 13.4 Gleason score: 4+4=8 Biopsy (06/17/15): 5/12 cores positive    Left: L lateral apex (60%, 4+3=7), L apex (70%, 4+3=7), L lateral mid (20%, 4+3=7), L mid (70%, 4+4=8), L lateral base (90%, 4+3=7)    Right: Benign Prostate volume: 33.5 cc  Nomogram OC disease: 18% EPE: 79% SVI: 23% LNI: 24% PFS (surgery): 32% at 5 years, 20% at 10 years  Urinary function: IPSS is 4. Erectile function: SHIM score is 19.   Past Medical History  1. History of Anxiety (F41.9)  2. History of hyperlipidemia (Z86.39)  3. History of hypertension (Z86.79)  Surgical  History  1. History of Inguinal Hernia Repair  Current Meds  1. Aspirin 81 MG TABS;  Therapy: (Recorded:28Mar2017) to Recorded  2. Ativan 1 MG Oral Tablet;  Therapy: (Recorded:28Mar2017) to Recorded  3. Benadryl 25 MG CAPS;  Therapy: (Recorded:28Mar2017) to Recorded  4. EpiPen 0.3 MG/0.3ML DEVI;  Therapy: (Recorded:28Mar2017) to Recorded  5. LevoFLOXacin 500 MG Oral Tablet; 1 tablet the day before procedure, 1 tablet day of  procedure, and 1 tablet day after procedure;  Therapy: [id number] to (Last Rx:29Mar2017)  Requested for: 29Mar2017 Ordered  6. Lipitor 10 MG Oral Tablet;  Therapy: (Recorded:28Mar2017) to Recorded  Allergies  1. No Known Drug Allergies  Family History  1. Family history of diabetes mellitus (Z83.3) : Mother  2. Family history of hypertension (Z82.49) : Father  3. Family history of prostate cancer (Z80.42) : Father, Paternal Uncle  Social History   Alcohol use (Z78.9)   Current every day smoker (F17.200)   Single  Physical Exam Constitutional: Well nourished and well developed . No acute distress.  ENT:. The ears and nose are normal in appearance.  Neck: The appearance of the neck is normal and no neck mass is present.  Pulmonary: No respiratory distress, normal respiratory rhythm and effort and clear bilateral breath sounds.  Cardiovascular: Heart rate and rhythm are normal . No peripheral edema.  Abdomen: The abdomen is soft and nontender. No masses are palpated. No CVA tenderness. No hernias are palpable. No hepatosplenomegaly noted.  Lymphatics: The femoral and inguinal nodes are not enlarged or tender.  Skin: Normal skin turgor, no visible rash and no visible skin lesions.  Neuro/Psych:. Mood and affect are appropriate.      Assessment  1. Prostate cancer (C61)    Discussion/Summary  1.  Prostate cancer: He has elected to proceed with surgical treatment and will undergo a RAL radical prostatectomy and BPLND.

## 2015-08-02 ENCOUNTER — Inpatient Hospital Stay (HOSPITAL_COMMUNITY): Payer: PRIVATE HEALTH INSURANCE | Admitting: Anesthesiology

## 2015-08-02 ENCOUNTER — Encounter (HOSPITAL_COMMUNITY): Payer: Self-pay

## 2015-08-02 ENCOUNTER — Inpatient Hospital Stay (HOSPITAL_COMMUNITY)
Admission: RE | Admit: 2015-08-02 | Discharge: 2015-08-03 | DRG: 708 | Disposition: A | Discharge status: Home / Self Care | Payer: PRIVATE HEALTH INSURANCE | Source: Ambulatory Visit | Attending: Urology | Admitting: Urology

## 2015-08-02 ENCOUNTER — Encounter (HOSPITAL_COMMUNITY)
Admission: RE | Disposition: A | Discharge status: Home / Self Care | Payer: Self-pay | Source: Ambulatory Visit | Attending: Urology

## 2015-08-02 DIAGNOSIS — I10 Essential (primary) hypertension: Secondary | ICD-10-CM | POA: Diagnosis present

## 2015-08-02 DIAGNOSIS — Z7982 Long term (current) use of aspirin: Secondary | ICD-10-CM

## 2015-08-02 DIAGNOSIS — Z6833 Body mass index (BMI) 33.0-33.9, adult: Secondary | ICD-10-CM | POA: Diagnosis not present

## 2015-08-02 DIAGNOSIS — Z833 Family history of diabetes mellitus: Secondary | ICD-10-CM | POA: Diagnosis not present

## 2015-08-02 DIAGNOSIS — F17210 Nicotine dependence, cigarettes, uncomplicated: Secondary | ICD-10-CM | POA: Diagnosis present

## 2015-08-02 DIAGNOSIS — C61 Malignant neoplasm of prostate: Principal | ICD-10-CM | POA: Diagnosis present

## 2015-08-02 DIAGNOSIS — Z8042 Family history of malignant neoplasm of prostate: Secondary | ICD-10-CM

## 2015-08-02 DIAGNOSIS — Z79899 Other long term (current) drug therapy: Secondary | ICD-10-CM | POA: Diagnosis not present

## 2015-08-02 DIAGNOSIS — Z8249 Family history of ischemic heart disease and other diseases of the circulatory system: Secondary | ICD-10-CM | POA: Diagnosis not present

## 2015-08-02 DIAGNOSIS — E785 Hyperlipidemia, unspecified: Secondary | ICD-10-CM | POA: Diagnosis present

## 2015-08-02 HISTORY — PX: LYMPHADENECTOMY: SHX5960

## 2015-08-02 HISTORY — PX: ROBOT ASSISTED LAPAROSCOPIC RADICAL PROSTATECTOMY: SHX5141

## 2015-08-02 LAB — HEMOGLOBIN AND HEMATOCRIT, BLOOD
HEMATOCRIT: 38.9 % — AB (ref 39.0–52.0)
HEMOGLOBIN: 13.1 g/dL (ref 13.0–17.0)

## 2015-08-02 SURGERY — XI ROBOTIC ASSISTED LAPAROSCOPIC RADICAL PROSTATECTOMY LEVEL 2
Anesthesia: General

## 2015-08-02 MED ORDER — ATORVASTATIN CALCIUM 10 MG PO TABS
10.0000 mg | ORAL_TABLET | Freq: Every day | ORAL | Status: DC
  Administered 2015-08-02: 10 mg via ORAL
  Filled 2015-08-02: qty 1

## 2015-08-02 MED ORDER — MEPERIDINE HCL 50 MG/ML IJ SOLN
6.2500 mg | INTRAMUSCULAR | Status: DC | PRN
Start: 2015-08-02 — End: 2015-08-02

## 2015-08-02 MED ORDER — BUPIVACAINE-EPINEPHRINE (PF) 0.25% -1:200000 IJ SOLN
INTRAMUSCULAR | Status: AC
  Filled 2015-08-02: qty 30

## 2015-08-02 MED ORDER — LACTATED RINGERS IV SOLN
INTRAVENOUS | Status: DC | PRN
  Administered 2015-08-02 (×3): via INTRAVENOUS

## 2015-08-02 MED ORDER — SCOPOLAMINE 1 MG/3DAYS TD PT72
MEDICATED_PATCH | TRANSDERMAL | Status: DC | PRN
  Administered 2015-08-02: 1 via TRANSDERMAL

## 2015-08-02 MED ORDER — ROCURONIUM BROMIDE 50 MG/5ML IV SOLN
INTRAVENOUS | Status: AC
  Filled 2015-08-02: qty 1

## 2015-08-02 MED ORDER — HYDROCODONE-ACETAMINOPHEN 5-325 MG PO TABS: 1.0000 | ORAL_TABLET | Freq: Four times a day (QID) | ORAL | Status: DC | PRN

## 2015-08-02 MED ORDER — SUCCINYLCHOLINE CHLORIDE 20 MG/ML IJ SOLN
INTRAMUSCULAR | Status: DC | PRN
  Administered 2015-08-02: 100 mg via INTRAVENOUS

## 2015-08-02 MED ORDER — PROPOFOL 10 MG/ML IV BOLUS
INTRAVENOUS | Status: DC | PRN
  Administered 2015-08-02: 200 mg via INTRAVENOUS

## 2015-08-02 MED ORDER — ROCURONIUM BROMIDE 50 MG/5ML IV SOLN
INTRAVENOUS | Status: AC
  Filled 2015-08-02: qty 2

## 2015-08-02 MED ORDER — SODIUM CHLORIDE 0.9 % IV BOLUS (SEPSIS)
1000.0000 mL | Freq: Once | INTRAVENOUS | Status: AC
  Administered 2015-08-02: 1000 mL via INTRAVENOUS

## 2015-08-02 MED ORDER — PROPOFOL 10 MG/ML IV BOLUS
INTRAVENOUS | Status: AC
  Filled 2015-08-02: qty 20

## 2015-08-02 MED ORDER — OXYCODONE HCL 5 MG PO TABS: 5.0000 mg | ORAL_TABLET | Freq: Once | ORAL | Status: DC | PRN

## 2015-08-02 MED ORDER — SULFAMETHOXAZOLE-TRIMETHOPRIM 800-160 MG PO TABS: 1.0000 | ORAL_TABLET | Freq: Two times a day (BID) | ORAL | Status: DC

## 2015-08-02 MED ORDER — OXYCODONE HCL 5 MG/5ML PO SOLN
5.0000 mg | Freq: Once | ORAL | Status: DC | PRN
  Filled 2015-08-02: qty 5

## 2015-08-02 MED ORDER — HYDROMORPHONE HCL 2 MG/ML IJ SOLN
INTRAMUSCULAR | Status: AC
  Filled 2015-08-02: qty 1

## 2015-08-02 MED ORDER — HEPARIN SODIUM (PORCINE) 1000 UNIT/ML IJ SOLN
INTRAMUSCULAR | Status: AC
Start: 2015-08-02 — End: 2015-08-02
  Filled 2015-08-02: qty 1

## 2015-08-02 MED ORDER — KETOROLAC TROMETHAMINE 15 MG/ML IJ SOLN
15.0000 mg | Freq: Four times a day (QID) | INTRAMUSCULAR | Status: DC
  Administered 2015-08-02 – 2015-08-03 (×4): 15 mg via INTRAVENOUS
  Filled 2015-08-02 (×4): qty 1

## 2015-08-02 MED ORDER — STERILE WATER FOR IRRIGATION IR SOLN
Status: DC | PRN
  Administered 2015-08-02: 1000 mL

## 2015-08-02 MED ORDER — SCOPOLAMINE 1 MG/3DAYS TD PT72
MEDICATED_PATCH | TRANSDERMAL | Status: AC
  Filled 2015-08-02: qty 1

## 2015-08-02 MED ORDER — DIPHENHYDRAMINE HCL 50 MG/ML IJ SOLN: 12.5000 mg | Freq: Four times a day (QID) | INTRAMUSCULAR | Status: DC | PRN

## 2015-08-02 MED ORDER — ACETAMINOPHEN 325 MG PO TABS: 650.0000 mg | ORAL_TABLET | ORAL | Status: DC | PRN

## 2015-08-02 MED ORDER — BUPIVACAINE-EPINEPHRINE 0.25% -1:200000 IJ SOLN
INTRAMUSCULAR | Status: DC | PRN
  Administered 2015-08-02: 30 mL

## 2015-08-02 MED ORDER — MIDAZOLAM HCL 2 MG/2ML IJ SOLN
INTRAMUSCULAR | Status: DC | PRN
  Administered 2015-08-02: 2 mg via INTRAVENOUS

## 2015-08-02 MED ORDER — SUFENTANIL CITRATE 50 MCG/ML IV SOLN
INTRAVENOUS | Status: DC | PRN
  Administered 2015-08-02 (×2): 10 ug via INTRAVENOUS
  Administered 2015-08-02: 20 ug via INTRAVENOUS
  Administered 2015-08-02: 10 ug via INTRAVENOUS

## 2015-08-02 MED ORDER — CEFAZOLIN SODIUM 1-5 GM-% IV SOLN
1.0000 g | Freq: Three times a day (TID) | INTRAVENOUS | Status: AC
  Administered 2015-08-02 (×2): 1 g via INTRAVENOUS
  Filled 2015-08-02 (×2): qty 50

## 2015-08-02 MED ORDER — ROCURONIUM BROMIDE 100 MG/10ML IV SOLN
INTRAVENOUS | Status: DC | PRN
  Administered 2015-08-02: 20 mg via INTRAVENOUS
  Administered 2015-08-02: 50 mg via INTRAVENOUS
  Administered 2015-08-02 (×2): 20 mg via INTRAVENOUS

## 2015-08-02 MED ORDER — HYDROMORPHONE HCL 1 MG/ML IJ SOLN: 0.2500 mg | INTRAMUSCULAR | Status: DC | PRN

## 2015-08-02 MED ORDER — MIDAZOLAM HCL 2 MG/2ML IJ SOLN
INTRAMUSCULAR | Status: AC
  Filled 2015-08-02: qty 2

## 2015-08-02 MED ORDER — EPHEDRINE SULFATE 50 MG/ML IJ SOLN
INTRAMUSCULAR | Status: AC
  Filled 2015-08-02: qty 1

## 2015-08-02 MED ORDER — SUFENTANIL CITRATE 50 MCG/ML IV SOLN
INTRAVENOUS | Status: AC
Start: 2015-08-02 — End: 2015-08-02
  Filled 2015-08-02: qty 1

## 2015-08-02 MED ORDER — LORAZEPAM 0.5 MG PO TABS
0.5000 mg | ORAL_TABLET | Freq: Three times a day (TID) | ORAL | Status: DC | PRN
Start: 2015-08-02 — End: 2015-08-03

## 2015-08-02 MED ORDER — SODIUM CHLORIDE 0.9 % IR SOLN
Status: DC | PRN
  Administered 2015-08-02: 1000 mL

## 2015-08-02 MED ORDER — DOCUSATE SODIUM 100 MG PO CAPS
100.0000 mg | ORAL_CAPSULE | Freq: Two times a day (BID) | ORAL | Status: DC
  Administered 2015-08-02 – 2015-08-03 (×2): 100 mg via ORAL
  Filled 2015-08-02 (×2): qty 1

## 2015-08-02 MED ORDER — DEXAMETHASONE SODIUM PHOSPHATE 10 MG/ML IJ SOLN
INTRAMUSCULAR | Status: DC | PRN
  Administered 2015-08-02: 10 mg via INTRAVENOUS

## 2015-08-02 MED ORDER — CEFAZOLIN SODIUM-DEXTROSE 2-4 GM/100ML-% IV SOLN
INTRAVENOUS | Status: AC
Start: 2015-08-02 — End: 2015-08-02
  Filled 2015-08-02: qty 100

## 2015-08-02 MED ORDER — SUGAMMADEX SODIUM 200 MG/2ML IV SOLN
INTRAVENOUS | Status: AC
  Filled 2015-08-02: qty 2

## 2015-08-02 MED ORDER — CEFAZOLIN SODIUM-DEXTROSE 2-4 GM/100ML-% IV SOLN
2.0000 g | INTRAVENOUS | Status: AC
  Administered 2015-08-02: 2 g via INTRAVENOUS

## 2015-08-02 MED ORDER — HEPARIN SODIUM (PORCINE) 1000 UNIT/ML IJ SOLN
INTRAMUSCULAR | Status: AC
  Filled 2015-08-02: qty 1

## 2015-08-02 MED ORDER — PNEUMOCOCCAL VAC POLYVALENT 25 MCG/0.5ML IJ INJ
0.5000 mL | INJECTION | INTRAMUSCULAR | Status: AC
  Administered 2015-08-03: 0.5 mL via INTRAMUSCULAR
  Filled 2015-08-02 (×2): qty 0.5

## 2015-08-02 MED ORDER — SODIUM CHLORIDE 0.9 % IJ SOLN
INTRAMUSCULAR | Status: AC
  Filled 2015-08-02: qty 10

## 2015-08-02 MED ORDER — DEXAMETHASONE SODIUM PHOSPHATE 10 MG/ML IJ SOLN
INTRAMUSCULAR | Status: AC
Start: 2015-08-02 — End: 2015-08-02
  Filled 2015-08-02: qty 1

## 2015-08-02 MED ORDER — ONDANSETRON HCL 4 MG/2ML IJ SOLN
4.0000 mg | INTRAMUSCULAR | Status: DC | PRN
  Administered 2015-08-02: 4 mg via INTRAVENOUS
  Filled 2015-08-02: qty 2

## 2015-08-02 MED ORDER — BELLADONNA ALKALOIDS-OPIUM 16.2-60 MG RE SUPP
1.0000 | Freq: Four times a day (QID) | RECTAL | Status: DC | PRN
  Administered 2015-08-02: 1 via RECTAL
  Filled 2015-08-02: qty 1

## 2015-08-02 MED ORDER — ONDANSETRON HCL 4 MG/2ML IJ SOLN
INTRAMUSCULAR | Status: DC | PRN
  Administered 2015-08-02: 4 mg via INTRAVENOUS

## 2015-08-02 MED ORDER — LIDOCAINE HCL (CARDIAC) 20 MG/ML IV SOLN
INTRAVENOUS | Status: AC
Start: 2015-08-02 — End: 2015-08-02
  Filled 2015-08-02: qty 5

## 2015-08-02 MED ORDER — ONDANSETRON HCL 4 MG/2ML IJ SOLN
INTRAMUSCULAR | Status: AC
  Filled 2015-08-02: qty 2

## 2015-08-02 MED ORDER — KCL IN DEXTROSE-NACL 20-5-0.45 MEQ/L-%-% IV SOLN
INTRAVENOUS | Status: DC
  Administered 2015-08-02 – 2015-08-03 (×3): via INTRAVENOUS
  Filled 2015-08-02 (×4): qty 1000

## 2015-08-02 MED ORDER — SUGAMMADEX SODIUM 200 MG/2ML IV SOLN
INTRAVENOUS | Status: DC | PRN
  Administered 2015-08-02: 200 mg via INTRAVENOUS

## 2015-08-02 MED ORDER — LIDOCAINE HCL (CARDIAC) 20 MG/ML IV SOLN
INTRAVENOUS | Status: DC | PRN
  Administered 2015-08-02: 100 mg via INTRAVENOUS

## 2015-08-02 MED ORDER — DIPHENHYDRAMINE HCL 12.5 MG/5ML PO ELIX: 12.5000 mg | ORAL_SOLUTION | Freq: Four times a day (QID) | ORAL | Status: DC | PRN

## 2015-08-02 MED ORDER — MORPHINE SULFATE (PF) 2 MG/ML IV SOLN: 2.0000 mg | INTRAVENOUS | Status: DC | PRN

## 2015-08-02 MED ORDER — LACTATED RINGERS IV SOLN
INTRAVENOUS | Status: DC | PRN
  Administered 2015-08-02: 08:00:00

## 2015-08-02 MED ORDER — HYDROMORPHONE HCL 1 MG/ML IJ SOLN
INTRAMUSCULAR | Status: DC | PRN
  Administered 2015-08-02 (×5): .4 mg via INTRAVENOUS

## 2015-08-02 SURGICAL SUPPLY — 51 items
APPLICATOR COTTON TIP 6IN STRL (MISCELLANEOUS) ×4 IMPLANT
CATH FOLEY 2WAY SLVR 18FR 30CC (CATHETERS) ×4 IMPLANT
CATH ROBINSON RED A/P 16FR (CATHETERS) ×4 IMPLANT
CATH ROBINSON RED A/P 8FR (CATHETERS) ×4 IMPLANT
CATH TIEMANN FOLEY 18FR 5CC (CATHETERS) ×4 IMPLANT
CHLORAPREP W/TINT 26ML (MISCELLANEOUS) ×4 IMPLANT
CLIP LIGATING HEM O LOK PURPLE (MISCELLANEOUS) ×8 IMPLANT
COVER SURGICAL LIGHT HANDLE (MISCELLANEOUS) ×4 IMPLANT
COVER TIP SHEARS 8 DVNC (MISCELLANEOUS) ×2 IMPLANT
COVER TIP SHEARS 8MM DA VINCI (MISCELLANEOUS) ×2
CUTTER ECHEON FLEX ENDO 45 340 (ENDOMECHANICALS) ×4 IMPLANT
DECANTER SPIKE VIAL GLASS SM (MISCELLANEOUS) ×2 IMPLANT
DRAPE ARM DVNC X/XI (DISPOSABLE) ×8 IMPLANT
DRAPE COLUMN DVNC XI (DISPOSABLE) ×2 IMPLANT
DRAPE DA VINCI XI ARM (DISPOSABLE) ×8
DRAPE DA VINCI XI COLUMN (DISPOSABLE) ×2
DRAPE SURG IRRIG POUCH 19X23 (DRAPES) ×4 IMPLANT
DRSG TEGADERM 4X4.75 (GAUZE/BANDAGES/DRESSINGS) ×4 IMPLANT
ELECT REM PT RETURN 9FT ADLT (ELECTROSURGICAL) ×4
ELECTRODE REM PT RTRN 9FT ADLT (ELECTROSURGICAL) ×2 IMPLANT
GLOVE BIO SURGEON STRL SZ 6.5 (GLOVE) ×3 IMPLANT
GLOVE BIO SURGEONS STRL SZ 6.5 (GLOVE) ×1
GLOVE BIOGEL M STRL SZ7.5 (GLOVE) ×8 IMPLANT
GOWN STRL REUS W/TWL LRG LVL3 (GOWN DISPOSABLE) ×12 IMPLANT
HOLDER FOLEY CATH W/STRAP (MISCELLANEOUS) ×4 IMPLANT
IV LACTATED RINGERS 1000ML (IV SOLUTION) ×4 IMPLANT
LIQUID BAND (GAUZE/BANDAGES/DRESSINGS) ×2 IMPLANT
NDL SAFETY ECLIPSE 18X1.5 (NEEDLE) ×2 IMPLANT
NEEDLE HYPO 18GX1.5 SHARP (NEEDLE) ×4
PACK ROBOT UROLOGY CUSTOM (CUSTOM PROCEDURE TRAY) ×4 IMPLANT
RELOAD GREEN ECHELON 45 (STAPLE) ×4 IMPLANT
SEAL CANN UNIV 5-8 DVNC XI (MISCELLANEOUS) ×8 IMPLANT
SEAL XI 5MM-8MM UNIVERSAL (MISCELLANEOUS) ×8
SET TUBE IRRIG SUCTION NO TIP (IRRIGATION / IRRIGATOR) ×4 IMPLANT
SOLUTION ELECTROLUBE (MISCELLANEOUS) ×4 IMPLANT
SUT ETHILON 3 0 PS 1 (SUTURE) ×4 IMPLANT
SUT MNCRL 3 0 RB1 (SUTURE) ×2 IMPLANT
SUT MNCRL 3 0 VIOLET RB1 (SUTURE) ×2 IMPLANT
SUT MNCRL AB 4-0 PS2 18 (SUTURE) ×8 IMPLANT
SUT MONOCRYL 3 0 RB1 (SUTURE) ×4
SUT VIC AB 0 CT1 27 (SUTURE) ×4
SUT VIC AB 0 CT1 27XBRD ANTBC (SUTURE) ×2 IMPLANT
SUT VIC AB 0 UR5 27 (SUTURE) ×4 IMPLANT
SUT VIC AB 2-0 SH 27 (SUTURE) ×4
SUT VIC AB 2-0 SH 27X BRD (SUTURE) ×2 IMPLANT
SUT VICRYL 0 UR6 27IN ABS (SUTURE) ×8 IMPLANT
SYR 27GX1/2 1ML LL SAFETY (SYRINGE) ×4 IMPLANT
TOWEL OR 17X26 10 PK STRL BLUE (TOWEL DISPOSABLE) ×4 IMPLANT
TOWEL OR NON WOVEN STRL DISP B (DISPOSABLE) ×4 IMPLANT
TUBING INSUFFLATION 10FT LAP (TUBING) ×2 IMPLANT
WATER STERILE IRR 1500ML POUR (IV SOLUTION) ×4 IMPLANT

## 2015-08-02 NOTE — Anesthesia Procedure Notes (Signed)
Procedure Name: Intubation Date/Time: 08/02/2015 7:48 AM Performed by: Danley Danker L Patient Re-evaluated:Patient Re-evaluated prior to inductionOxygen Delivery Method: Circle system utilized Preoxygenation: Pre-oxygenation with 100% oxygen Intubation Type: IV induction Ventilation: Mask ventilation without difficulty and Oral airway inserted - appropriate to patient size Laryngoscope Size: Miller and 3 Grade View: Grade I Tube type: Oral Tube size: 8.0 mm Number of attempts: 1 Airway Equipment and Method: Stylet Placement Confirmation: ETT inserted through vocal cords under direct vision,  positive ETCO2 and breath sounds checked- equal and bilateral Secured at: 22 cm Tube secured with: Tape Dental Injury: Teeth and Oropharynx as per pre-operative assessment

## 2015-08-02 NOTE — Discharge Instructions (Signed)

## 2015-08-02 NOTE — Progress Notes (Signed)
Day of Surgery   Subjective: The patient is doing well.  Tolerating clears without nausea or vomiting. Pain is adequately controlled. No flatus or BMs. He plans to walk shortly.  Objective: Vital signs in last 24 hours: Temp:  [97.7 F (36.5 C)-98.4 F (36.9 C)] 98 F (36.7 C) (05/22 1245) Pulse Rate:  [72-86] 74 (05/22 1245) Resp:  [11-19] 16 (05/22 1245) BP: (100-144)/(41-87) 131/84 mmHg (05/22 1245) SpO2:  [93 %-99 %] 95 % (05/22 1245) Weight:  [103.874 kg (229 lb)] 103.874 kg (229 lb) (05/22 0510)  Intake/Output from previous day:   Intake/Output this shift: Total I/O In: 4150 [I.V.:3100; IV Piggyback:1050] Out: 390 [Urine:150; Drains:90; Blood:150]  Physical Exam:  General: Alert and oriented. CV: RRR Lungs: Clear bilaterally. GI: Soft, Nondistended, appropriately tender to palpation. Incisions: Clean, dry, and intact Urine: Clear Extremities: Nontender, no erythema, no edema.  Lab Results:  Recent Labs  08/02/15 1158  HGB 13.1  HCT 38.9*      Assessment/Plan: POD#0 s/p robotic prostatectomy.  1) Clears, mIVF 2) Encouraged him not to take in too many clears - had just finished 1 tray and had ordered a second  2) Ambulate, Incentive spirometry 3) PRN percocet & IV morphine 4) F/u UOP 5) F/u JP drain output 6) Likely D/C tomorrow afternoon   LOS: 0 days   Acie Fredrickson 08/02/2015, 5:08 PM

## 2015-08-02 NOTE — Op Note (Signed)
Preoperative diagnosis: Clinically localized adenocarcinoma of the prostate (clinical stage T1c N0 M0)  Postoperative diagnosis: Clinically localized adenocarcinoma of the prostate (clinical stage T1c N0 M0)  Procedure:  1. Robotic assisted laparoscopic radical prostatectomy (bilateral nerve sparing) 2. Bilateral robotic assisted laparoscopic pelvic lymphadenectomy  Surgeon: Pryor Curia. M.D.  Assistant: Debbrah Alar, PA-C  Resident: Dr. Harvel Ricks  Anesthesia: General  Complications: None  EBL: 150 mL  IVF:  2000 mL crystalloid  Specimens: 1. Prostate and seminal vesicles 2. Right pelvic lymph nodes 3. Left pelvic lymph nodes  Disposition of specimens: Pathology  Drains: 1. 20 Fr coude catheter 2. # 19 Blake pelvic drain  Indication: Marc Ellis is a 50 y.o. year old patient with clinically localized prostate cancer.  After a thorough review of the management options for treatment of prostate cancer, he elected to proceed with surgical therapy and the above procedure(s).  We have discussed the potential benefits and risks of the procedure, side effects of the proposed treatment, the likelihood of the patient achieving the goals of the procedure, and any potential problems that might occur during the procedure or recuperation. Informed consent has been obtained.  Description of procedure:  The patient was taken to the operating room and a general anesthetic was administered. He was given preoperative antibiotics, placed in the dorsal lithotomy position, and prepped and draped in the usual sterile fashion. Next a preoperative timeout was performed. A urethral catheter was placed into the bladder and a site was selected near the umbilicus for placement of the camera port. This was placed using a standard open Hassan technique which allowed entry into the peritoneal cavity under direct vision and without difficulty. An 8 mm robotic port was placed and a  pneumoperitoneum established. The camera was then used to inspect the abdomen and there was no evidence of any intra-abdominal injuries or other abnormalities. The remaining abdominal ports were then placed. 8 mm robotic ports were placed in the right lower quadrant, left lower quadrant, and far left lateral abdominal wall. A 5 mm port was placed in the right upper quadrant and a 12 mm port was placed in the right lateral abdominal wall for laparoscopic assistance. All ports were placed under direct vision without difficulty. The surgical cart was then docked.   Utilizing the cautery scissors, the bladder was reflected posteriorly allowing entry into the space of Retzius and identification of the endopelvic fascia and prostate. The periprostatic fat was then removed from the prostate allowing full exposure of the endopelvic fascia. The endopelvic fascia was then incised from the apex back to the base of the prostate bilaterally and the underlying levator muscle fibers were swept laterally off the prostate thereby isolating the dorsal venous complex. The dorsal vein was then stapled and divided with a 45 mm Flex Echelon stapler. Attention then turned to the bladder neck which was divided anteriorly thereby allowing entry into the bladder and exposure of the urethral catheter. The catheter balloon was deflated and the catheter was brought into the operative field and used to retract the prostate anteriorly. The posterior bladder neck was then examined and was divided allowing further dissection between the bladder and prostate posteriorly until the vasa deferentia and seminal vessels were identified. The vasa deferentia were isolated, divided, and lifted anteriorly. The seminal vesicles were dissected down to their tips with care to control the seminal vascular arterial blood supply. These structures were then lifted anteriorly and the space between Denonvillier's fascia and the anterior  rectum was developed with a  combination of sharp and blunt dissection. This isolated the vascular pedicles of the prostate.  The lateral prostatic fascia was then sharply incised allowing release of the neurovascular bundles bilaterally. The vascular pedicles of the prostate were then ligated with Weck clips between the prostate and neurovascular bundles and divided with sharp cold scissor dissection resulting in neurovascular bundle preservation. The neurovascular bundles were then separated off the apex of the prostate and urethra bilaterally.  The urethra was then sharply transected allowing the prostate specimen to be disarticulated. The pelvis was copiously irrigated and hemostasis was ensured. There was no evidence for rectal injury.  Attention then turned to the right pelvic sidewall. The fibrofatty tissue between the external iliac vein, confluence of the iliac vessels, hypogastric artery, and Cooper's ligament was dissected free from the pelvic sidewall with care to preserve the obturator nerve. Weck clips were used for lymphostasis and hemostasis. An identical procedure was performed on the contralateral side and the lymphatic packets were removed for permanent pathologic analysis.  Attention then turned to the urethral anastomosis. A 2-0 Vicryl slip knot was placed between Denonvillier's fascia, the posterior bladder neck, and the posterior urethra to reapproximate these structures. A double-armed 3-0 Monocryl suture was then used to perform a 360 running tension-free anastomosis between the bladder neck and urethra. A new urethral catheter was then placed into the bladder and irrigated. There were no blood clots within the bladder and the anastomosis appeared to be watertight. A #19 Blake drain was then brought through the left lateral 8 mm port site and positioned appropriately within the pelvis. It was secured to the skin with a nylon suture. The surgical cart was then undocked. The right lateral 12 mm port site was  closed at the fascial level with a 0 Vicryl suture placed laparoscopically. All remaining ports were then removed under direct vision. The prostate specimen was removed intact within the Endopouch retrieval bag via the periumbilical camera port site. This fascial opening was closed with two running 0 Vicryl sutures. 0.25% Marcaine was then injected into all port sites and all incisions were reapproximated at the skin level with 4-0 Monocryl subcuticular sutures and Dermabond. The patient appeared to tolerate the procedure well and without complications. The patient was able to be extubated and transferred to the recovery unit in satisfactory condition.   Pryor Curia MD

## 2015-08-02 NOTE — Anesthesia Preprocedure Evaluation (Addendum)
Anesthesia Evaluation  Patient identified by MRN, date of birth, ID band Patient awake    Reviewed: Allergy & Precautions, NPO status , Patient's Chart, lab work & pertinent test results  Airway Mallampati: I  TM Distance: >3 FB Neck ROM: Full    Dental  (+) Teeth Intact, Dental Advisory Given   Pulmonary Current Smoker,    breath sounds clear to auscultation       Cardiovascular (-) hypertension Rhythm:Regular Rate:Normal     Neuro/Psych    GI/Hepatic   Endo/Other  Morbid obesity  Renal/GU      Musculoskeletal   Abdominal   Peds  Hematology   Anesthesia Other Findings   Reproductive/Obstetrics                            Anesthesia Physical Anesthesia Plan  ASA: II  Anesthesia Plan: General   Post-op Pain Management:    Induction: Intravenous  Airway Management Planned: Oral ETT  Additional Equipment:   Intra-op Plan:   Post-operative Plan: Extubation in OR  Informed Consent: I have reviewed the patients History and Physical, chart, labs and discussed the procedure including the risks, benefits and alternatives for the proposed anesthesia with the patient or authorized representative who has indicated his/her understanding and acceptance.   Dental advisory given  Plan Discussed with: CRNA, Anesthesiologist and Surgeon  Anesthesia Plan Comments:         Anesthesia Quick Evaluation

## 2015-08-02 NOTE — Anesthesia Postprocedure Evaluation (Signed)
Anesthesia Post Note  Patient: Marc Ellis  Procedure(s) Performed: Procedure(s) (LRB): XI ROBOTIC ASSISTED LAPAROSCOPIC RADICAL PROSTATECTOMY LEVEL 2 (N/A) PELVIC LYMPHADENECTOMY (Bilateral)  Patient location during evaluation: PACU Anesthesia Type: General Level of consciousness: awake and alert Pain management: pain level controlled Vital Signs Assessment: post-procedure vital signs reviewed and stable Respiratory status: spontaneous breathing, nonlabored ventilation, respiratory function stable and patient connected to nasal cannula oxygen Cardiovascular status: blood pressure returned to baseline and stable Postop Assessment: no signs of nausea or vomiting Anesthetic complications: no    Last Vitals:  Filed Vitals:   08/02/15 1215 08/02/15 1230  BP: 100/41   Pulse: 82 72  Temp: 36.6 C   Resp: 14 11    Last Pain:  Filed Vitals:   08/02/15 1230  PainSc: 0-No pain                 Michayla Mcneil A

## 2015-08-02 NOTE — Transfer of Care (Signed)
Immediate Anesthesia Transfer of Care Note  Patient: Marc Ellis  Procedure(s) Performed: Procedure(s): XI ROBOTIC ASSISTED LAPAROSCOPIC RADICAL PROSTATECTOMY LEVEL 2 (N/A) PELVIC LYMPHADENECTOMY (Bilateral)  Patient Location: PACU  Anesthesia Type:General  Level of Consciousness: sedated  Airway & Oxygen Therapy: Patient Spontanous Breathing and Patient connected to face mask oxygen  Post-op Assessment: Report given to RN and Post -op Vital signs reviewed and stable  Post vital signs: Reviewed and stable  Last Vitals:  Filed Vitals:   08/02/15 0510  BP: 127/80  Pulse: 79  Temp: 36.5 C  Resp: 16    Last Pain:  Filed Vitals:   08/02/15 0559  PainSc: 2          Complications: No apparent anesthesia complications

## 2015-08-03 ENCOUNTER — Encounter: Payer: Self-pay | Admitting: Medical Oncology

## 2015-08-03 LAB — HEMOGLOBIN AND HEMATOCRIT, BLOOD
HEMATOCRIT: 36.6 % — AB (ref 39.0–52.0)
Hemoglobin: 12.4 g/dL — ABNORMAL LOW (ref 13.0–17.0)

## 2015-08-03 LAB — TYPE AND SCREEN
ABO/RH(D): O POS
Antibody Screen: NEGATIVE

## 2015-08-03 MED ORDER — HYDROCODONE-ACETAMINOPHEN 5-325 MG PO TABS: 1.0000 | ORAL_TABLET | Freq: Four times a day (QID) | ORAL | Status: DC | PRN

## 2015-08-03 MED ORDER — BISACODYL 10 MG RE SUPP
10.0000 mg | Freq: Once | RECTAL | Status: AC
  Administered 2015-08-03: 10 mg via RECTAL
  Filled 2015-08-03: qty 1

## 2015-08-03 NOTE — Progress Notes (Signed)
1 Day Post-Op   Subjective: The patient is doing well.  No nausea or vomiting. Tolerating clears. Passing large amounts of flatus. No BMs. Has walked in halls multiple times. Pain is adequately controlled.  Objective: Vital signs in last 24 hours: Temp:  [97.6 F (36.4 C)-98.4 F (36.9 C)] 98.1 F (36.7 C) (05/23 0532) Pulse Rate:  [70-86] 76 (05/23 0532) Resp:  [11-19] 16 (05/23 0532) BP: (100-144)/(41-87) 110/65 mmHg (05/23 0532) SpO2:  [93 %-99 %] 98 % (05/23 0532)  Intake/Output from previous day: 05/22 0701 - 05/23 0700 In: 7880 [P.O.:1060; I.V.:5720; IV Piggyback:1100] Out: 4270 [Urine:3700; Drains:420; Blood:150] Intake/Output this shift:    Physical Exam:  General: Alert and oriented. CV: RRR Lungs: Clear bilaterally. GI: Soft, Nondistended, appropriately tender to palpation.  Incisions: Clean, dry, and intact Urine: Clear Extremities: Nontender, no erythema, no edema.  Lab Results:  Recent Labs  08/02/15 1158 08/03/15 0448  HGB 13.1 12.4*  HCT 38.9* 36.6*      Assessment/Plan: POD# 1 s/p robotic prostatectomy.  1) SL IVF, continue clears 2) Ambulate, Incentive spirometry 3) Transition to oral pain medication 4) Dulcolax suppository 5) Follow up JP output, D/C pelvic drain prior to discharge 6) Plan for likely discharge later today    LOS: 1 day   Acie Fredrickson 08/03/2015, 7:06 AM

## 2015-08-03 NOTE — Progress Notes (Signed)
Oncology Nurse Navigator Documentation  Oncology Nurse Navigator Flowsheets 06/25/2015 06/28/2015 08/03/2015  Navigator Location - - [facility]  Navigator Encounter Type Clinic/MDC - -  Telephone - - -  Abnormal Finding Date 04/13/2015 - 08/03/2015  Confirmed Diagnosis Date 06/17/2015 - -  Surgery Date - - 08/02/2015  Patient Visit Type Initial - Surgery- Marc Ellis had prostatectomy 08/02/15. He states he is doing well but hates the catheter. He will be discharged later today. We discussed the importance of walking, resting and not over doing it.He states he is not good at just sitting around. He will follow up with Dr. Alinda Money next Tuesday 5/30 for catheter removal and pathology results.He is scheduled for genetic counseling 08/30/15. I asked him to call me with any questions or concerns. He voiced understanding of the above.  Barriers/Navigation Needs Education - No barriers at this time  Education Coping with Diagnosis/ Prognosis - -  Interventions - Referrals -  Referrals - Genetics -  Coordination of Care - - -  Support Groups/Services Friends and Family - Friends and Family  Acuity - - -  Acuity Level 1 - - -  Time Spent with Patient > 120 15 30

## 2015-08-03 NOTE — Progress Notes (Signed)
JP removed.  Incision clean and dressed with gauze and tegaderm.  Foley teaching done.  Demonstrated emptying of bag and explained importance of foley care.  Pt verbalizes understanding.  Teach back complete.  Pt ambulated hallway several times.  Denies pain.

## 2015-08-03 NOTE — Discharge Summary (Signed)
  Date of admission: 08/02/2015  Date of discharge: 08/03/2015  Admission diagnosis: Prostate Cancer  Discharge diagnosis: Prostate Cancer  History and Physical: For full details, please see admission history and physical. Briefly, Marc Ellis is a 50 y.o. gentleman with localized prostate cancer.  After discussing management/treatment options, he elected to proceed with surgical treatment.  Hospital Course: DEVONTA BLANFORD was taken to the operating room on 08/02/2015 and underwent a robotic assisted laparoscopic radical prostatectomy. He tolerated this procedure well and without complications. Postoperatively, he was able to be transferred to a regular hospital room following recovery from anesthesia.  He was able to begin ambulating the night of surgery. He remained hemodynamically stable overnight.  He had excellent urine output with appropriately minimal output from his pelvic drain and his pelvic drain was removed on POD #1.  He was transitioned to oral pain medication, tolerated a clear liquid diet, and had met all discharge criteria and was able to be discharged home later on POD#1.  Laboratory values:  Recent Labs  08/02/15 1158 08/03/15 0448  HGB 13.1 12.4*  HCT 38.9* 36.6*    Disposition: Home  Discharge instruction: He was instructed to be ambulatory but to refrain from heavy lifting, strenuous activity, or driving. He was instructed on urethral catheter care.  Discharge medications:     Medication List    STOP taking these medications        aspirin EC 81 MG tablet     Melatonin 10 MG Tabs     UNABLE TO FIND      TAKE these medications        acetaminophen 500 MG tablet  Commonly known as:  TYLENOL  Take 1,000 mg by mouth every 6 (six) hours as needed (For pain.).     atorvastatin 10 MG tablet  Commonly known as:  LIPITOR  Take 10 mg by mouth at bedtime.     EPINEPHrine 0.3 mg/0.3 mL Soaj injection  Commonly known as:  EPI-PEN  Inject 0.3 mg into the  muscle once as needed (For anaphylaxis.).     HYDROcodone-acetaminophen 5-325 MG tablet  Commonly known as:  NORCO  Take 1-2 tablets by mouth every 6 (six) hours as needed for moderate pain.     LORazepam 1 MG tablet  Commonly known as:  ATIVAN  Take 0.5-1 mg by mouth every 8 (eight) hours as needed for anxiety.     nicotine 10 MG inhaler  Commonly known as:  NICOTROL  Inhale 1 continuous puffing into the lungs as needed for smoking cessation.     sulfamethoxazole-trimethoprim 800-160 MG tablet  Commonly known as:  BACTRIM DS,SEPTRA DS  Take 1 tablet by mouth 2 (two) times daily. Start the day prior to foley removal appointment        Followup: He will followup in 1 week for catheter removal and to discuss his surgical pathology results.

## 2015-08-12 ENCOUNTER — Encounter: Payer: Self-pay | Admitting: General Practice

## 2015-08-12 NOTE — Progress Notes (Signed)
Spiritual Care Note  Followed up with Marc Ellis by phone.  He was in great spirits, sharing at length about his very positive experience with every person he has encountered at [facility].  He reports doing very well after surgery--no pain/discomfort now, minimal leakage.  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  Per his request, sent him a f/u email so that I might forward a thank-you letter to leadership at both [facility], where he had his surgery, and [facility], where [facility] and nurse navigation have been so helpful for him.  He also plans to stop by Spiritual Care on 6/19, when he comes for genetic counseling.  Please page if needs arise/circumstances change.  Thank you.  [place], [place], [facility] Pager [phone] Voicemail [phone]

## 2015-08-25 ENCOUNTER — Ambulatory Visit (HOSPITAL_BASED_OUTPATIENT_CLINIC_OR_DEPARTMENT_OTHER): Payer: PRIVATE HEALTH INSURANCE | Admitting: Genetic Counselor

## 2015-08-25 ENCOUNTER — Encounter: Payer: Self-pay | Admitting: Genetic Counselor

## 2015-08-25 ENCOUNTER — Other Ambulatory Visit: Payer: PRIVATE HEALTH INSURANCE

## 2015-08-25 ENCOUNTER — Encounter: Payer: Self-pay | Admitting: General Practice

## 2015-08-25 DIAGNOSIS — C61 Malignant neoplasm of prostate: Secondary | ICD-10-CM | POA: Diagnosis not present

## 2015-08-25 DIAGNOSIS — Z8042 Family history of malignant neoplasm of prostate: Secondary | ICD-10-CM

## 2015-08-25 NOTE — Progress Notes (Signed)
Spiritual Care Note  [place] phoned to meet in person, per his plan prior to genetic counseling appt.  He continues to verbalize gratitude for smooth, fast, kind, supportive whole-person care at [facility].  In particular, pt states that he values availability of faith-based support.  He reports that he feels great, is back at work, and doing very well.  He is aware of ongoing Support Team availability, but please also page if needs arise/circumstances change.  Thank you.  [place], [place], [facility] Pager [phone] Voicemail [phone]

## 2015-08-25 NOTE — Progress Notes (Signed)
REFERRING PROVIDER: Marvis Repress, MD [address] [place], [place] [address]   Eli Hose, MD  Heloise Purpura, MD  PRIMARY PROVIDER:  Marvis Repress, MD  PRIMARY REASON FOR VISIT:  1. 50 yo man with stage T1c adenocarcinoma of the prostate with a Gleason's score of 4+4 and a PSA of 13.4 - High Risk   2. Prostate cancer (HCC)   3. Family history of prostate cancer      HISTORY OF PRESENT ILLNESS:   Mr. Wulf, a 50 y.o. male, was seen for a [place] cancer genetics consultation at the request of Dr. Clelia Croft due to a personal and family history of cancer.  Mr. Glazer presents to clinic today to discuss the possibility of a hereditary predisposition to cancer, genetic testing, and to further clarify his future cancer risks, as well as potential cancer risks for family members.   In May 2017, at the age of 75, Mr. Skibinski was diagnosed with aggressive cancer of the prostate.  His Gleason score was 7 and 8, and his PSA level was 13.4. This was treated with prostatectomy.     CANCER HISTORY:   No history exists.     RISK FACTORS:  Colonoscopy: no; not examined. Gleason Score: 7 and 8 PSA: 13.4 Exposures:  Worked in [employer] since 1987. Has been exposed to many chemicals, especially jet fuel.  Past Medical History  Diagnosis Date  . Anxiety   . Hyperlipidemia   . Hypertension     NO MEDS FOR HAS BEEN A LITTLE ELEVATED  . Elevated PSA   . Prostate cancer (HCC) JAN 2017  . Family history of prostate cancer     Past Surgical History  Procedure Laterality Date  . Prostate biopsy    . Inguinal hernia repair  1972  . Robot assisted laparoscopic radical prostatectomy N/A 08/02/2015    Procedure: XI ROBOTIC ASSISTED LAPAROSCOPIC RADICAL PROSTATECTOMY LEVEL 2;  Surgeon: Heloise Purpura, MD;  Location: [facility];  Service: Urology;  Laterality: N/A;  . Lymphadenectomy Bilateral 08/02/2015    Procedure: PELVIC LYMPHADENECTOMY;  Surgeon: Heloise Purpura, MD;  Location: [facility];   Service: Urology;  Laterality: Bilateral;    Social History   Social History  . Marital Status: Widowed    Spouse Name: N/A  . Number of Children: 3  . Years of Education: N/A   Social History Main Topics  . Smoking status: Current Every Day Smoker -- 1.00 packs/day for 20 years    Types: Cigarettes  . Smokeless tobacco: Never Used  . Alcohol Use: No  . Drug Use: No  . Sexual Activity: Not Asked   Other Topics Concern  . None   Social History Narrative     FAMILY HISTORY:  We obtained a detailed, 4-generation family history.  Significant diagnoses are listed below: Family History  Problem Relation Age of Onset  . Prostate cancer Father 72  . Prostate cancer Paternal Uncle     dx in his 72s  . Kidney failure Sister   . Diabetes Maternal Aunt   . Hypertension Maternal Grandmother   . Prostate cancer Paternal Grandfather     dx in his 47s    The patient has two daughters and a son who are cancer free. He has one sister who had kidney failure in her early 41s and needed a transplant.  His parents are alive.  His mother has one full sister and four maternal half siblings - two brothers and two sisters.  None had cancer.  The  patient's father had prostate cancer at 54.  He had a brother and a sister.  The brother had prostate cancer in his early 27s.  The patient's paternal grandparents are deceasd.  His grandmother fell and died in her 58s and his grandfather died of a bleeding ulcer in his 31s, but also had prostate cancer in his 9s.  Patient's maternal ancestors are of [place], [place] and [place] descent, and paternal ancestors are of [place] and [place] descent. There is no reported Ashkenazi Jewish ancestry. There is no known consanguinity.  GENETIC COUNSELING ASSESSMENT: YASHAR INCLAN is a 50 y.o. male with a personal and family history of prostate cancer which is somewhat suggestive of a hereditary cancer syndrome and predisposition to cancer. We, therefore, discussed and  recommended the following at today's visit.   DISCUSSION: Approximately 5-10% of prostate cancer is hereditary.  It can be due to a genetic mutation in a gene.  Sometimes that gene is associated with prostate cancer only, such as in HOXB13 gene, or it can be associated with other cancers, such as in the BRCA or Lynch syndrome genes.  We reviewed the characteristics, features and inheritance patterns of hereditary cancer syndromes. We also discussed genetic testing, including the appropriate family members to test, the process of testing, insurance coverage and turn-around-time for results. We discussed the implications of a negative, positive and/or variant of uncertain significant result. We recommended Mr. Jarrard pursue genetic testing for the Prostate cancer gene panel. The Prostate gene panel offered by [facility] includes sequencing and rearrangement analysis for the following 12 genes:  ATM, BRCA1, BRCA2, CHEK2, EPCAM, HOXB13, MLH1, MSH2, MSH6, NBN, PMS2, and TP53.     Based on Mr. Higinbotham's personal and family history of cancer, he meets medical criteria for genetic testing. Despite that he meets criteria, he may still have an out of pocket cost. We discussed that if his out of pocket cost for testing is over $100, the laboratory will call and confirm whether he wants to proceed with testing.  If the out of pocket cost of testing is less than $100 he will be billed by the genetic testing laboratory.   PLAN: After considering the risks, benefits, and limitations, Mr. Harnish  provided informed consent to pursue genetic testing and the blood sample was sent to [facility] for analysis of the Prostate cancer panel. Results should be available within approximately 2-3 weeks' time, at which point they will be disclosed by telephone to Mr. Rolf, as will any additional recommendations warranted by these results. Mr. Quach will receive a summary of his genetic counseling visit and a copy of his  results once available. This information will also be available in Epic. We encouraged Mr. Umbarger to remain in contact with cancer genetics annually so that we can continuously update the family history and inform him of any changes in cancer genetics and testing that may be of benefit for his family. Mr. Ciampi questions were answered to his satisfaction today. Our contact information was provided should additional questions or concerns arise.  Lastly, we encouraged Mr. Stillinger to remain in contact with cancer genetics annually so that we can continuously update the family history and inform him of any changes in cancer genetics and testing that may be of benefit for this family.   Mr.  Leaming questions were answered to his satisfaction today. Our contact information was provided should additional questions or concerns arise. Thank you for the referral and allowing [place] to share in the care  of your patient.   Azel Gumina P. Florene Glen, [place], [facility] Certified Genetic Counselor [email or web address] phone: [phone]  The patient was seen for a total of 45 minutes in face-to-face genetic counseling.  This patient was discussed with Drs. Magrinat, Lindi Adie and/or Burr Medico who agrees with the above.    _______________________________________________________________________ For Office Staff:  Number of people involved in session: 1 Was an Intern/ student involved with case: yes [employer]

## 2015-08-30 ENCOUNTER — Encounter: Payer: PRIVATE HEALTH INSURANCE | Admitting: Genetic Counselor

## 2015-08-30 ENCOUNTER — Other Ambulatory Visit: Payer: PRIVATE HEALTH INSURANCE

## 2015-09-22 ENCOUNTER — Encounter: Payer: Self-pay | Admitting: Genetic Counselor

## 2015-09-22 ENCOUNTER — Telehealth: Payer: Self-pay | Admitting: Genetic Counselor

## 2015-09-22 DIAGNOSIS — Z1379 Encounter for other screening for genetic and chromosomal anomalies: Secondary | ICD-10-CM | POA: Insufficient documentation

## 2015-09-22 NOTE — Telephone Encounter (Signed)
Revealed negative genetic testing.  Discussed that his daughters are not thought to be at higher risk for breast/ovarian cancer but that his son will need to undergo screening about 10 years younger than his age of onset based on his dx and the family history.  He voiced his understanding.

## 2015-09-24 ENCOUNTER — Ambulatory Visit: Payer: Self-pay | Admitting: Genetic Counselor

## 2015-09-24 DIAGNOSIS — Z1379 Encounter for other screening for genetic and chromosomal anomalies: Secondary | ICD-10-CM

## 2015-09-24 DIAGNOSIS — Z8042 Family history of malignant neoplasm of prostate: Secondary | ICD-10-CM

## 2015-09-24 DIAGNOSIS — C61 Malignant neoplasm of prostate: Secondary | ICD-10-CM

## 2015-09-24 NOTE — Progress Notes (Signed)
HPI: Mr. Kai was previously seen in the [place] clinic due to a personal and family history of cancer and concerns regarding a hereditary predisposition to cancer. Please refer to our prior cancer genetics clinic note for more information regarding Mr. Mccorvey's medical, social and family histories, and our assessment and recommendations, at the time. Mr. Kienast recent genetic test results were disclosed to him, as were recommendations warranted by these results. These results and recommendations are discussed in more detail below.  FAMILY HISTORY:  We obtained a detailed, 4-generation family history.  Significant diagnoses are listed below: Family History  Problem Relation Age of Onset  . Prostate cancer Father 71  . Prostate cancer Paternal Uncle     dx in his 39s  . Kidney failure Sister   . Diabetes Maternal Aunt   . Hypertension Maternal Grandmother   . Prostate cancer Paternal Grandfather     dx in his 87s    The patient has two daughters and a son who are cancer free. He has one sister who had kidney failure in her early 69s and needed a transplant. His parents are alive. His mother has one full sister and four maternal half siblings - two brothers and two sisters. None had cancer. The patient's father had prostate cancer at 54. He had a brother and a sister. The brother had prostate cancer in his early 6s. The patient's paternal grandparents are deceasd. His grandmother fell and died in her 60s and his grandfather died of a bleeding ulcer in his 76s, but also had prostate cancer in his 86s. Patient's maternal ancestors are of [place], [place] and [place] descent, and paternal ancestors are of [place] and [place] descent. There is no reported Ashkenazi Jewish ancestry. There is no known consanguinity.  GENETIC TEST RESULTS: At the time of Mr. Laster visit, we recommended he pursue genetic testing of the Prostate cancer gene panel. The Hereditary Prostate  Cancer Panel offered by [facility] includes sequencing and deletion/duplication analysis of the following 12 genes: ATM, BRCA1, BRCA2, CHEK2, EPCAM, HOXB13, MLH1, MSH2, MSH6, NBN, PMS2 and TP53. The report date is September 21, 2015.  Genetic testing was normal, and did not reveal a deleterious mutation in these genes. The test report has been scanned into EPIC and is located under the Molecular Pathology section of the Results Review tab.   We discussed with Mr. Bosher that since the current genetic testing is not perfect, it is possible there may be a gene mutation in one of these genes that current testing cannot detect, but that chance is small. We also discussed, that it is possible that another gene that has not yet been discovered, or that we have not yet tested, is responsible for the cancer diagnoses in the family, and it is, therefore, important to remain in touch with cancer genetics in the future so that we can continue to offer Mr. Olmeda the most up to date genetic testing.   CANCER SCREENING RECOMMENDATIONS: Given Mr. Baba's personal and family histories, we must interpret these negative results with some caution.  Families with features suggestive of hereditary risk for cancer tend to have multiple family members with cancer, diagnoses in multiple generations and diagnoses before the age of 58. Mr. Simonich family exhibits some of these features. Thus this result may simply reflect our current inability to detect all mutations within these genes or there may be a different gene that has not yet been discovered or tested.   RECOMMENDATIONS FOR FAMILY  MEMBERS: Women in this family might be at some increased risk of developing cancer, over the general population risk, simply due to the family history of cancer. We recommended women in this family have a yearly mammogram beginning at age 30, or 49 years younger than the earliest onset of cancer, an an annual clinical breast exam, and perform  monthly breast self-exams. Women in this family should also have a gynecological exam as recommended by their primary provider. All family members should have a colonoscopy by age 67.  FOLLOW-UP: Lastly, we discussed with Mr. Demarais that cancer genetics is a rapidly advancing field and it is possible that new genetic tests will be appropriate for him and/or his family members in the future. We encouraged him to remain in contact with cancer genetics on an annual basis so we can update his personal and family histories and let him know of advances in cancer genetics that may benefit this family.   Our contact number was provided. Mr. Guidotti questions were answered to his satisfaction, and he knows he is welcome to call us at anytime with additional questions or concerns.   Roma Kayser, MS, [facility] Certified Genetic Counselor [email or web address]

## 2016-01-07 ENCOUNTER — Telehealth: Payer: Self-pay | Admitting: Medical Oncology

## 2016-01-07 NOTE — Progress Notes (Signed)
Mr. Theil states he is totally recovered from his prostatectomy. He has been working non-stop since 11/19/15 with [employer]. No complaints and he will follow up with Dr. Alinda Money 01/26/16. I asked him to call me if I can be of assistance in any way. He voiced understanding and thanked me for the call.

## 2016-04-21 ENCOUNTER — Encounter (HOSPITAL_COMMUNITY): Payer: Self-pay

## 2016-05-23 ENCOUNTER — Ambulatory Visit (HOSPITAL_COMMUNITY): Payer: PRIVATE HEALTH INSURANCE | Admitting: Psychiatry

## 2016-05-29 ENCOUNTER — Encounter (HOSPITAL_COMMUNITY): Payer: Self-pay | Admitting: Psychiatry

## 2016-05-29 ENCOUNTER — Ambulatory Visit (INDEPENDENT_AMBULATORY_CARE_PROVIDER_SITE_OTHER): Payer: PRIVATE HEALTH INSURANCE | Admitting: Psychiatry

## 2016-05-29 VITALS — Ht 70.0 in | Wt 224.0 lb

## 2016-05-29 DIAGNOSIS — F17210 Nicotine dependence, cigarettes, uncomplicated: Secondary | ICD-10-CM

## 2016-05-29 DIAGNOSIS — F064 Anxiety disorder due to known physiological condition: Secondary | ICD-10-CM

## 2016-05-29 DIAGNOSIS — Z79899 Other long term (current) drug therapy: Secondary | ICD-10-CM

## 2016-05-29 DIAGNOSIS — F410 Panic disorder [episodic paroxysmal anxiety] without agoraphobia: Secondary | ICD-10-CM | POA: Diagnosis not present
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
## 2016-07-26 ENCOUNTER — Ambulatory Visit (HOSPITAL_COMMUNITY): Payer: Self-pay | Admitting: Psychiatry

## 2016-08-02 IMAGING — MR MR PROSTATE WO/W CM
22 of 54 series · 22 of 54 positions shown · IV contrast (yes)
Comparison: Bone scan 06/23/2015

CLINICAL DATA: T1c adenocarcinoma prostate, with Gleason 4 + 4. PSA
of 13.4. High risk. Preoperative planning, for nerve-sparing
resection.

EXAM:
MR PROSTATE WITHOUT AND WITH CONTRAST
TECHNIQUE: Multiplanar multisequence MRI images were obtained of the pelvis
centered about the prostate. Pre and post contrast images were
obtained.
CONTRAST:  20mL MULTIHANCE GADOBENATE DIMEGLUMINE 529 MG/ML IV SOLN

[Series 4: bSSFP fat-sat · axial · 6.0mm · 0.86mm/px · 1 of 42 slices shown]
[im 1/42]
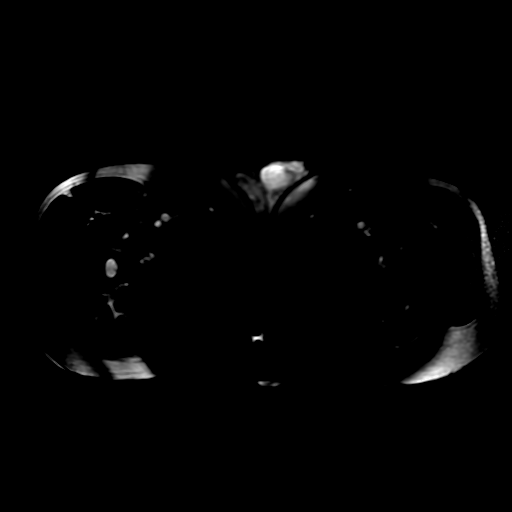

[Series 5: T1 · axial · 8.0mm · 0.70mm/px · 1 of 30 slices shown (1 of 2)]
[im 1/30]
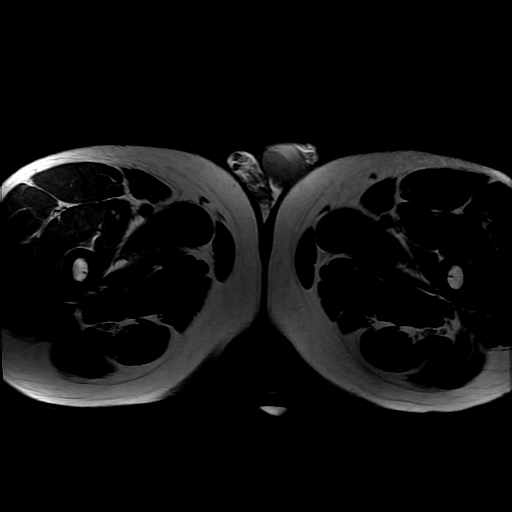

[Series 6: T2 · axial · 3.0mm · 0.29mm/px · 1 of 23 slices shown (1 of 4)]
[im 1/23]
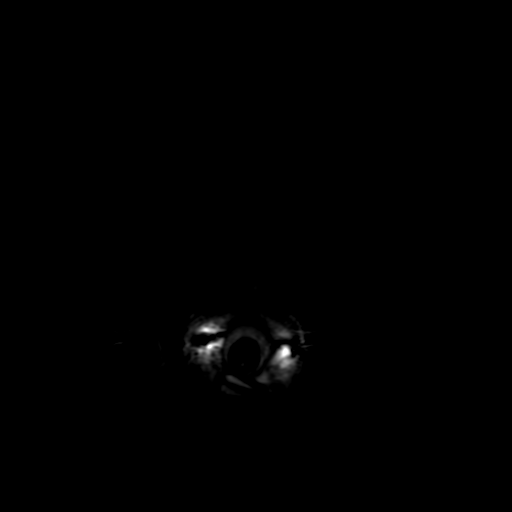

[Series 7: T1 · axial · 3.0mm · 0.29mm/px · 1 of 23 slices shown (2 of 2)]
[im 1/23]
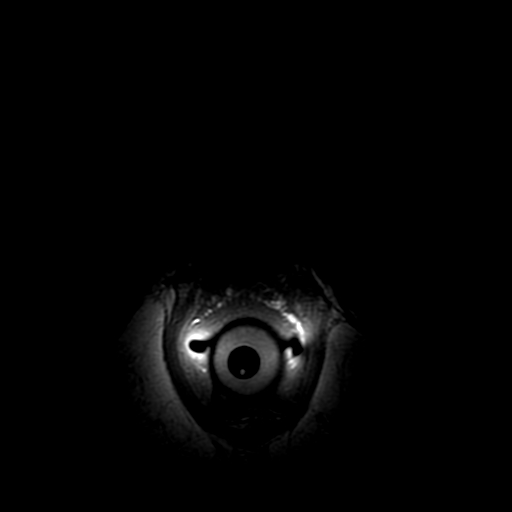

[Series 8: T2 · axial · 3.0mm · 0.29mm/px · 1 of 23 slices shown (2 of 4)]
[im 1/23]
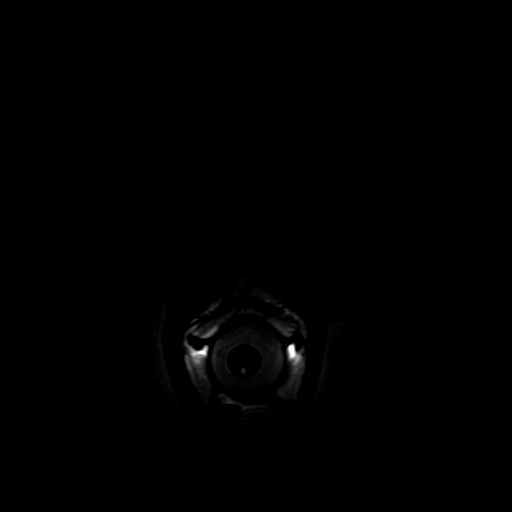

[Series 9: T2 · sagittal · 4.0mm · 0.29mm/px · 1 of 25 slices shown (3 of 4)]
[im 1/25]
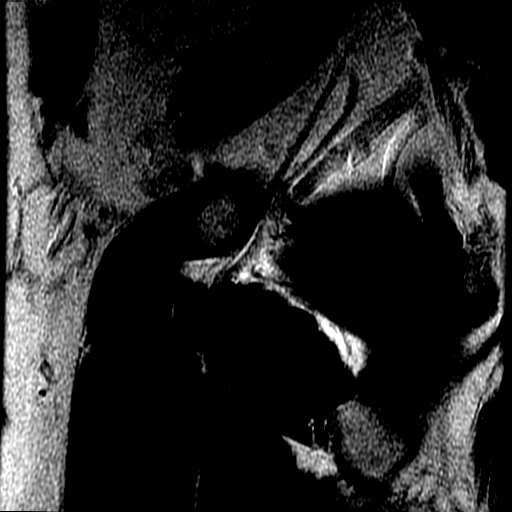

[Series 10: T2 · coronal · 4.0mm · 0.29mm/px · 1 of 20 slices shown (4 of 4)]
[im 1/20]
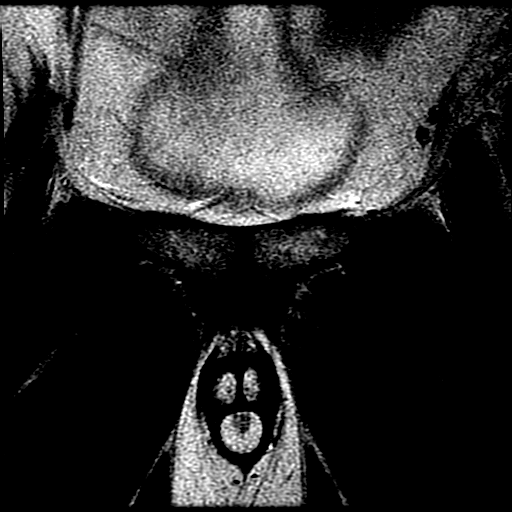

[Series 11: DWI · axial · 3.0mm · 0.59mm/px · 1 of 46 slices shown (1 of 6)]
[im 1/46]
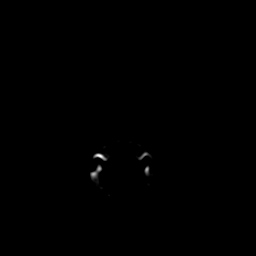

[Series 12: DWI · axial · 3.0mm · 0.59mm/px · 1 of 45 slices shown (2 of 6)]
[im 1/45]
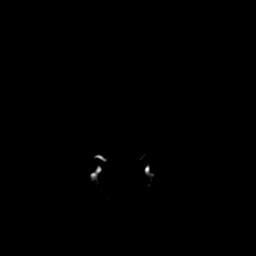

[Series 13: DWI · axial · 3.0mm · 0.59mm/px · 1 of 45 slices shown (3 of 6)]
[im 1/45]
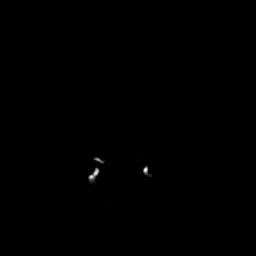

[Series 1100: DWI · axial · 3.0mm · 0.59mm/px · 1 of 23 slices shown (4 of 6)]
[im 1/23]
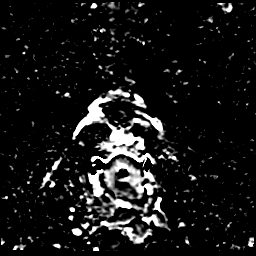

[Series 1200: DWI · axial · 3.0mm · 0.59mm/px · 1 of 23 slices shown (5 of 6)]
[im 1/23]
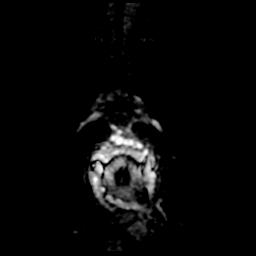

[Series 1300: DWI · axial · 3.0mm · 0.59mm/px · 1 of 23 slices shown (6 of 6)]
[im 1/23]
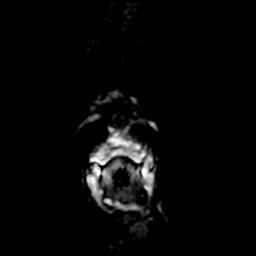

[((id)/(id)/1)-((id)/(id)/1) · axial · 3.0mm · 0.43mm/px · 1 of 67 slices shown (1 of 9)]
[im 1/67]
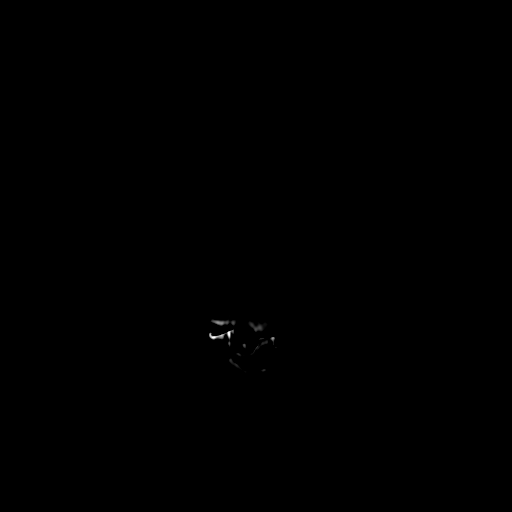

[((id)/(id)/1)-((id)/(id)/1) · axial · 3.0mm · 0.43mm/px · 1 of 68 slices shown (2 of 9)]
[im 1/68]
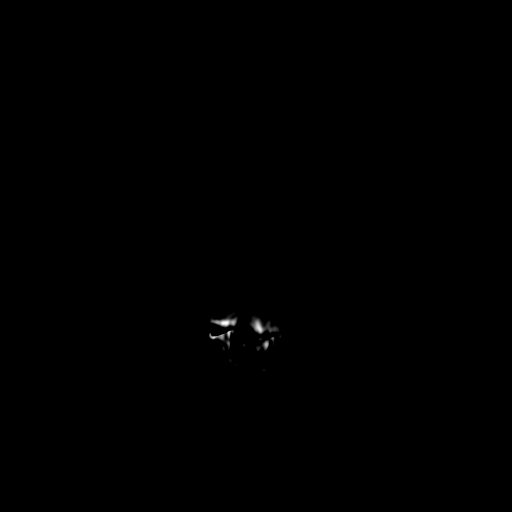

[((id)/(id)/1)-((id)/(id)/1) · axial · 3.0mm · 0.43mm/px · 1 of 68 slices shown (3 of 9)]
[im 1/68]
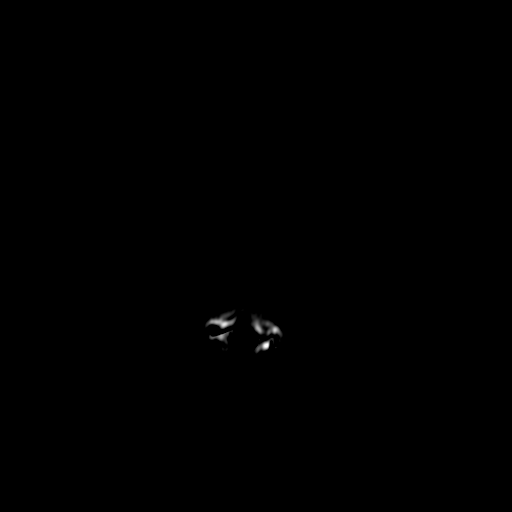

[((id)/(id)/1)-((id)/(id)/1) · axial · 3.0mm · 0.43mm/px · 1 of 68 slices shown (4 of 9)]
[im 1/68]
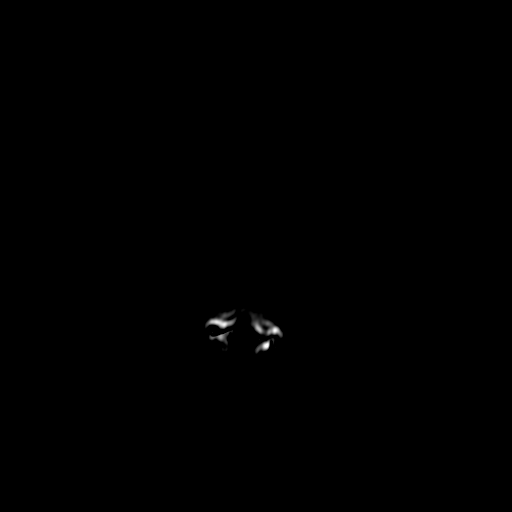

[((id)/(id)/1)-((id)/(id)/1) · axial · 3.0mm · 0.43mm/px · 1 of 67 slices shown (5 of 9)]
[im 1/67]
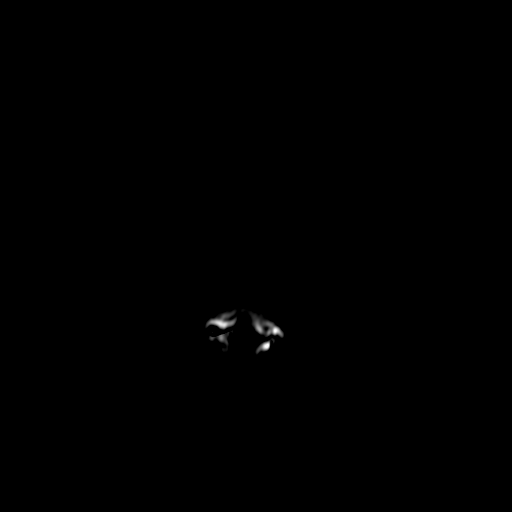

[((id)/(id)/1)-((id)/(id)/1) · axial · 3.0mm · 0.43mm/px · 1 of 68 slices shown (6 of 9)]
[im 1/68]
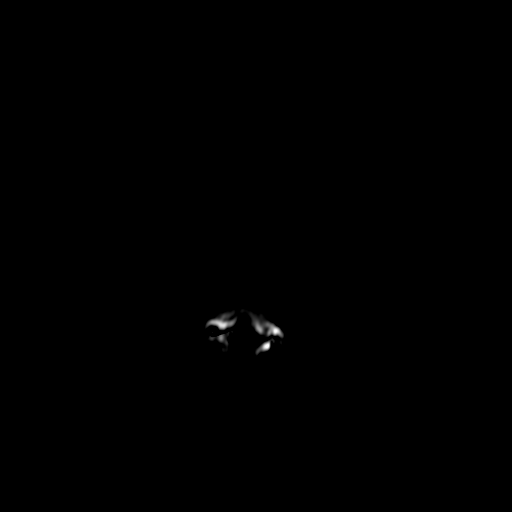

[((id)/(id)/1)-((id)/(id)/1) · axial · 3.0mm · 0.43mm/px · 1 of 68 slices shown (7 of 9)]
[im 1/68]
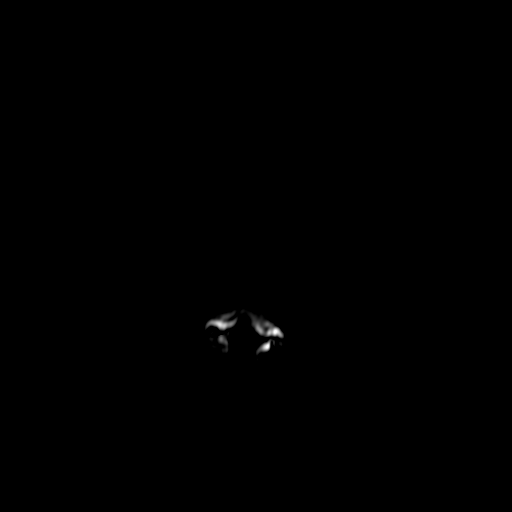

[((id)/(id)/1)-((id)/(id)/1) · axial · 3.0mm · 0.43mm/px · 1 of 67 slices shown (8 of 9)]
[im 1/67]
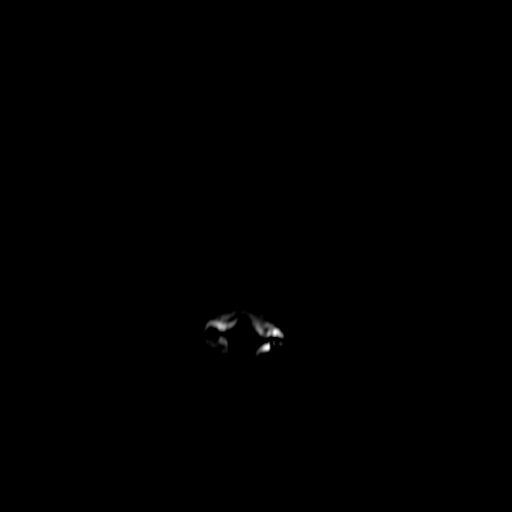

[((id)/(id)/1)-((id)/(id)/1) · axial · 3.0mm · 0.43mm/px · 1 of 68 slices shown (9 of 9)]
[im 1/68]
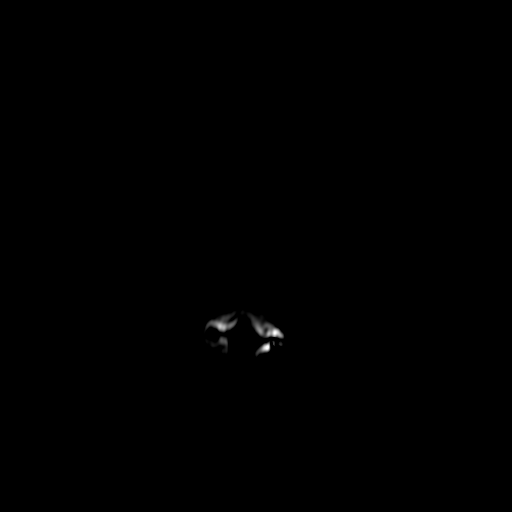

[22 of 54 positions shown; findings below may reference images not displayed]

FINDINGS: Prostate: Mild motion degradation, primarily the axial T2 weighted
high-resolution images. Areas of precontrast T1 hyperintensity on
series 7 are likely biopsy related.

Vague heterogeneous peripheral zone T2 signal, most apparent
throughout the right mid gland, including on image 15/series 8.
Nonspecific.

An area of macroscopic tumor is identified at 7 mm transverse on
image 18/series 8 and 10 x 10 mm craniocaudal on image 8/series 10.
This is positioned within the left apex, primarily laterally.
Although this contacts the capsule over an approximately 8 mm span
on image 8/series 10, no gross trans capsular spread is identified.
Concurrent restricted diffusion on image 5/series 4233. Vague early
post-contrast enhancement, including image 45/series 76229.

Smaller focus of nonspecific T2 hypointensity is identified about
the posterior left mid gland on image 14/series 8. Equivocal
restricted diffusion on image 19/series 4233 and heterogeneous
arterial phase hyper enhancement within this area including on image
42/series 76229.

Transcapsular spread:  Absent

Seminal vesicle involvement: Absent

Neurovascular bundle involvement: Absent

Pelvic adenopathy: Absent

Bone metastasis: Absent

Other findings: Normal urinary bladder.  No significant free fluid.
IMPRESSION: 1. High grade, macroscopic carcinoma identified at the left apex.
More vague areas of T2 hypo intensity with restricted diffusion and
arterial hyper enhancement within the left mid gland. Given the
biopsy results, these likely represent smaller foci of high grade
disease.
2.  No evidence of locally advanced or pelvic metastatic disease.
3. Mild motion degradation.

## 2017-09-27 ENCOUNTER — Encounter: Payer: Self-pay | Admitting: Radiation Oncology

## 2017-10-10 NOTE — Progress Notes (Signed)
GU Location of Tumor / Histology: adenocarcinoma of the prostate   If Prostate Cancer, Gleason Score is ( 4+ 4) and PSA is (13.4)  01-26-16   PSA    0.015 [facility] Marc Ellis.  08-02-16    PSA   [address] Marc Ellis. 03-14-17    PSA   0.042  [facility] 09-19-17     PSA  0.073  [facility]  Marc Ellis presented  months ago with signs/symptoms of: 52 year old gentleman with prostate cancer diagnosed in April 2017. His PSA up to 13 and a Gleason score 4+4 = 8. He has high volume disease with high risk features. His staging workup is unremarkable for metastatic disease. He has very little urinary symptoms at this time per Dr. Alen Blew.   Biopsies of  (if applicable) revealed:     Past/Anticipated interventions by urology, if any: Dr. Raynelle Bring  FINAL DIAGNOSIS Diagnosis 08-02-15   Dr. Raynelle Bring 1. Prostate, radical resection - PROSTATIC ADENOCARCINOMA, GLEASON GRADE 4+3=7 INVOLVING APPROXIMATELY 17% OF PROSTATE TISSUE. - TUMOR INVOLVES BILATERAL PROSTATE. - PERINEURAL INVASION IS IDENTIFIED. - PROSTATIC ADENOCARCINOMA FOCALLY INVOLVES THE LEFT POSTERIOR MARGIN. - OTHER MARGINS ARE NEGATIVE. - SEE ONCOLOGY TEMPLATE. 2. Lymph nodes, regional resection, right pelvic - THIRTEEN BENIGN LYMPH NODES WITH NO TUMOR SEEN (0/13) 3. Lymph nodes, regional resection, left pelvic - NINE BENIGN LYMPH NODES WITH NO TUMOR SEEN. (0/9). Microscopic Comment 1. PROSTATE RADICAL RESECTION/PROSTATECTOMY Procedure: Prostatectomy.   Past/Anticipated interventions by medical oncology, if any:Dr, Zola Button  08-24-17 Genetic Testing negative Zola Button seen last 06-25-15  He has high volume disease with high risk features. His staging workup is unremarkable for metastatic disease. He has very little urinary symptoms at this time.  His case was discussed today in the multidisciplinary prostate cancer clinic and his pathology specimen was discussed with the  reviewing pathologist. His imaging studies were also reviewed by radiology. Clearly he has a high risk cancer that requires definitive therapy. His options would include surgical therapy versus radiation therapy and androgen deprivation for at least 2 years. Given his young age and aggressive nature of his cancer, who would be reasonable to consider primary surgical therapy as the treatment modality of choice. He is agreeable to proceed with this option at this time.  We have discussed the implication of his strong family history of prostate cancer. His case was also discussed with genetic counselor who felt he would benefit from counseling and he is agreeable to it. He has 3 children including son and 2 daughters and the implication of genetic testing might impact there future cancer screening strategies. We will set that up for him as well given his agreement at this time.  I see no role for other systemic therapy at this time such as chemotherapy, immunotherapy unless he develops advanced disease.   Weight changes, if any:  Wt Readings from Last 3 Encounters:  10/17/17 243 lb 12.8 oz (110.6 kg)  08/02/15 229 lb (103.9 kg)  07/29/15 229 lb (103.9 kg)    Bowel/Bladder complaints, if any: IPSS 5 Bowel movement daily, occasional leakage Nausea/Vomiting, if any: No  Pain issues, if any: No   SAFETY ISSUES:  Prior radiation? : No  Pacemaker/ICD? :No  Possible current pregnancy? N/A  Is the patient on methotrexate?:No  Current Complaints / other details:  BP (!) 162/95 (BP Location: Right Arm, Patient Position: Sitting, Cuff Size: Large)   Pulse 80   Temp 98.6 F (37 C) (Oral)  Resp 20   Ht 5\' 10"  (1.778 m)   Wt 243 lb 12.8 oz (110.6 kg)   SpO2 98%   BMI 34.98 kg/m

## 2017-10-17 ENCOUNTER — Ambulatory Visit
Admission: RE | Admit: 2017-10-17 | Discharge: 2017-10-17 | Disposition: A | Discharge status: Home / Self Care | Payer: PRIVATE HEALTH INSURANCE | Source: Ambulatory Visit | Attending: Urology | Admitting: Urology

## 2017-10-17 ENCOUNTER — Ambulatory Visit
Admission: RE | Admit: 2017-10-17 | Discharge: 2017-10-17 | Disposition: A | Discharge status: Home / Self Care | Payer: PRIVATE HEALTH INSURANCE | Source: Ambulatory Visit | Attending: Radiation Oncology | Admitting: Radiation Oncology

## 2017-10-17 ENCOUNTER — Other Ambulatory Visit: Payer: Self-pay

## 2017-10-17 ENCOUNTER — Encounter: Payer: Self-pay | Admitting: Urology

## 2017-10-17 VITALS — BP 162/95 | HR 80 | Temp 98.6°F | Resp 20 | Ht 70.0 in | Wt 243.8 lb

## 2017-10-17 DIAGNOSIS — C61 Malignant neoplasm of prostate: Secondary | ICD-10-CM | POA: Insufficient documentation

## 2017-10-17 DIAGNOSIS — Z8042 Family history of malignant neoplasm of prostate: Secondary | ICD-10-CM | POA: Insufficient documentation

## 2017-10-17 DIAGNOSIS — F419 Anxiety disorder, unspecified: Secondary | ICD-10-CM | POA: Insufficient documentation

## 2017-10-17 DIAGNOSIS — F17210 Nicotine dependence, cigarettes, uncomplicated: Secondary | ICD-10-CM | POA: Insufficient documentation

## 2017-10-17 DIAGNOSIS — Z79899 Other long term (current) drug therapy: Secondary | ICD-10-CM | POA: Insufficient documentation

## 2017-10-17 NOTE — Progress Notes (Signed)
Radiation Oncology         [phone] ________________________________  Outpatient Follow-up New Visit  Name: Marc Ellis MRN: [id number]  Date: 10/17/2017  DOB: 09/15/1965  RW:[id number], Marc Peng, MD  Raynelle Bring, MD   REFERRING PHYSICIAN: Raynelle Bring, MD  DIAGNOSIS: 52 y.o. gentleman with rising detectable PSA at 0.073, 2 years status post RALP with BPLND for pT2cN0, Gleason 4+3, adenocarcinoma of the prostate and pre-treatment PSA of 13.4.   ICD-10-CM   1. Prostate cancer ([place]) C61     HISTORY OF PRESENT ILLNESS::Marc Ellis is a 52 y.o. gentleman.  He was noted to have an elevated PSA of 13.4 by his PCP, Tacy Dura, PA-C.  Accordingly, he was referred for evaluation in urology by Dr. Alinda Money on 06/09/15,  digital rectal examination was performed at that time revealing no nodules.  The patient proceeded to transrectal ultrasound with 12 biopsies of the prostate on 06/17/15.  The prostate volume measured 33.51 cc.  Out of 12 core biopsies, 5 were positive.  The maximum Gleason score was 4+4, and this was seen in the left mid gland. Bone scan and pelvic CT on 06/23/15 showed no findings of metastatic disease.  We initially met the patient at the multidisciplinary prostate cancer clinic in 06/2015 for presentation of pathology and radiology studies in our conference for discussion of potential radiation treatment options and clinical evaluation.  The patient elected to proceed with prostatectomy.  He underwent a BNS RAL radical prostatectomy with BPLND on 08/02/15.  Final pathology returned pT2cN0, Gleason 3+4 disease with a positive posterior margin and evidence of perineural invasion.  All 22 nodes sampled were negative and there was no evidence of extraprostatic extension or seminal vesicle involvement.  His initial postoperative PSA was undetectable in July and November 2017.  On Aug 02, 2016 his PSA was noted at 0.018 and has slowly risen since that time but remaining extremely low.   His most recent PSA on 09/19/2017 was 0.073.  He has regained excellent urinary continence since his surgery but continues to struggle with erectile dysfunction.  PSA trend: 08/02/16: 0.018 03/14/17: 0.042 09/19/17: 0.073   He has been kindly referred back to [place] today to discuss the potential role of salvage radiotherapy to the prostate fossa in the management of his disease.  PREVIOUS RADIATION THERAPY: No  PAST MEDICAL HISTORY:  has a past medical history of Anxiety, Elevated PSA, Family history of prostate cancer, Hyperlipidemia, Hypertension, and Prostate cancer ([place]) (JAN 2017).    PAST SURGICAL HISTORY: Past Surgical History:  Procedure Laterality Date  . [place]  . LYMPHADENECTOMY Bilateral 08/02/2015   Procedure: PELVIC LYMPHADENECTOMY;  Surgeon: Raynelle Bring, MD;  Location: [facility];  Service: Urology;  Laterality: Bilateral;  . PROSTATE BIOPSY    . ROBOT ASSISTED LAPAROSCOPIC RADICAL PROSTATECTOMY N/A 08/02/2015   Procedure: XI ROBOTIC ASSISTED LAPAROSCOPIC RADICAL PROSTATECTOMY LEVEL 2;  Surgeon: Raynelle Bring, MD;  Location: [facility];  Service: Urology;  Laterality: N/A;    FAMILY HISTORY: family history includes Diabetes in his maternal aunt; Hypertension in his maternal grandmother; Kidney failure in his sister; Prostate cancer in his paternal grandfather and paternal uncle; Prostate cancer (age of onset: 43) in his father.  SOCIAL HISTORY:  reports that he has been smoking cigarettes.  He has a 20.00 pack-year smoking history. He has never used smokeless tobacco. He reports that he does not drink alcohol or use drugs. He lives in [facility] but works full-time in [place] for  [employer]. He notes that he will be helping his daughter move into [employer] on Friday 10/26/17.  ALLERGIES: Patient has no known allergies.  MEDICATIONS:  Current Outpatient Medications  Medication Sig Dispense Refill  . acetaminophen (TYLENOL) 500 MG tablet Take 1,000 mg by  mouth every 6 (six) hours as needed (For pain.).    Marland Kitchen atorvastatin (LIPITOR) 10 MG tablet Take 10 mg by mouth at bedtime.     Marland Kitchen azelastine (ASTELIN) 0.1 % nasal spray USE 2 (TWO) SPRAYS EACH NOSTRIL, AT BEDTIME    . cetirizine (ZYRTEC) 10 MG tablet Take 10 mg by mouth daily.    . fluticasone (FLONASE) 50 MCG/ACT nasal spray 1 spray by Both Nostrils route daily.    . hydrOXYzine (VISTARIL) 25 MG capsule TAKE 1 CAPSULE BY MOUTH AT BEDTIME FOR ANXIETY AND INSOMNIA - INCREASE TO 2 IF NEEDED    . LORazepam (ATIVAN) 1 MG tablet Take 0.5-1 mg by mouth every 8 (eight) hours as needed for anxiety.     . Melatonin 10 MG CAPS Take by mouth.    . montelukast (SINGULAIR) 10 MG tablet TAKE 1 TABLET BY MOUTH EVERY DAY    . nicotine (NICOTROL) 10 MG inhaler Inhale 1 continuous puffing into the lungs as needed for smoking cessation.    Marland Kitchen EPINEPHrine 0.3 mg/0.3 mL IJ SOAJ injection Inject 0.3 mg into the muscle once as needed (For anaphylaxis.).     No current facility-administered medications for this encounter.     REVIEW OF SYSTEMS: On review of systems, the patient reports that he is doing well overall. He denies any chest pain, shortness of breath, cough, fevers, chills, night sweats, unintended weight changes. He denies any bowel or bladder disturbances, and denies abdominal pain, nausea or vomiting. He denies any new musculoskeletal or joint aches or pains, new skin lesions or concerns. The patient completed an IPSS and IIEF questionnaire.  His IPSS score was 5 indicating mild urinary outflow obstructive symptoms.  He has regained full bladder control since prostatectomy.  He indicated that regarding his erectile function, he is able to complete sexual activity on most attempts. A complete review of systems is obtained and is otherwise negative.    PHYSICAL EXAM:   height is _0  (1.778 m) and weight is 243 lb 12.8 oz (110.6 kg). His oral temperature is 98.6 F (37 C). His blood pressure is 162/95  (abnormal) and his pulse is 80. His respiration is 20 and oxygen saturation is 98%.   In general, this is a well-appearing Caucasian male in no acute distress.  He is alert and oriented x4 and appropriate throughout the exam.  HEENT reveals that the patient is normocephalic, atraumatic.  EOMs are intact.  PERRLA.  Skin is intact without any evidence of gross lesions.  Cardiovascular exam reveals a regular rate and rhythm, no clicks, rubs or murmurs are auscultated.  Chest is clear to auscultation bilaterally.  Lymphatic assessment is performed and does not reveal any adenopathy in the cervical, supraclavicular or axillary chains.  The abdomen has active bowel sounds in all quadrants and is intact.  Abdomen is soft, nontender and nondistended.  Lower extremities are negative for pretibial pitting edema, deep calf tenderness, cyanosis or clubbing.  KPS = 100  100 - Normal; no complaints; no evidence of disease. 90   - Able to carry on normal activity; minor signs or symptoms of disease. 80   - Normal activity with effort; some signs or symptoms of disease. 70   -  Cares for self; unable to carry on normal activity or to do active work. 60   - Requires occasional assistance, but is able to care for most of his personal needs. 50   - Requires considerable assistance and frequent medical care. 80   - Disabled; requires special care and assistance. 31   - Severely disabled; hospital admission is indicated although death not imminent. 29   - Very sick; hospital admission necessary; active supportive treatment necessary. 10   - Moribund; fatal processes progressing rapidly. 0     - Dead  Karnofsky DA, Abelmann Harrington, Craver LS and Burchenal [facility] [phone]) The use of the nitrogen mustards in the palliative treatment of carcinoma: with particular reference to bronchogenic carcinoma Cancer 1 634-56   LABORATORY DATA:  Lab Results  Component Value Date   WBC 10.4 07/29/2015   HGB 12.4 (L) 08/03/2015   HCT 36.6  (L) 08/03/2015   MCV 91.6 07/29/2015   PLT 271 07/29/2015   Lab Results  Component Value Date   NA 139 07/29/2015   K 4.3 07/29/2015   CL 108 07/29/2015   CO2 24 07/29/2015   No results found for: ALT, AST, GGT, ALKPHOS, BILITOT   RADIOGRAPHY: No results found.    IMPRESSION/PLAN:   1. 52 y.o. gentleman with rising detectable PSA at 0.073, 2 years status post RALP with BPLND for pT2cN0, Gleason 4+3, adenocarcinoma of the prostate and pre-treatment PSA of 13.4.  Today we reviewed the findings and workup thus far.  We discussed the natural history of prostate cancer.  We reviewed the the implications of positive margins, extracapsular extension, and seminal vesicle involvement on the risk of prostate cancer recurrence, especially in the setting of rising, detectable PSA levels. We reviewed some of the evidence suggesting an advantage for patients who undergo adjuvant radiotherapy in the setting in terms of disease control and overall survival. We also discussed some of the dilemmas related to the available evidence.  We discussed the SWOG trial which did show an improvement in disease-free survival as well as overall survival using adjuvant radiotherapy. However, we discussed the fact that the study did not carefully control the usage of adjuvant radiotherapy in the observation arm. There is increasing evidence that careful surveillance with ultrasensitive PSA may provide an opportunity for early salvage in patients who undergo observation, which can lead to excellent results in terms of disease control and survival. We discussed radiation treatment directed to the prostatic fossa with regard to the logistics and delivery of external beam radiation treatment.  The recommendation is to proceed with a 7-1/2-week course of daily radiotherapy treatments to the prostate fossa.  We discussed the risks, benefits, short and long-term side effects associated with radiotherapy in this setting.  The patient was  encouraged to ask questions that were answered to his stated satisfaction.  At the conclusion of our conversation, the patient elects to proceed with salvage radiotherapy to the prostate fossa as recommended. We will share our discussion with Dr. Alinda Money and move forward with treatment planning accordingly.  He would like to begin treatments as soon as possible so we have scheduled him for [facility] on Friday 10/19/17 at 8:30am in anticipation of beginning treatments on 10/25/17.  We enjoyed seeing him again today, and will look forward to participating in the care of this very nice gentleman.   More than 50% of today's visit was spent in counseling and/or coordination of care.     Nicholos Johns, PA-C  Tyler Pita, MD  [facility]  Radiation Oncology Direct Dial: [phone]  Fax: [phone] [email or web address]  Skype  LinkedIn  This document serves as a record of services personally performed by Tyler Pita, MD and Freeman Caldron, PA-C. It was created on their behalf by Wilburn Mylar, a trained medical scribe. The creation of this record is based on the scribe's personal observations and the provider's statements to them. This document has been checked and approved by the attending provider.    References:  JAMA. 2006 Nov 15;296(19):2329-35.  Adjuvant radiotherapy for pathologically advanced prostate cancer: a randomized clinical trial.  Grandville Silos IM Jr(1), Tangen CM, Hettie Holstein MS, Ova Freshwater, Messing Wayland Denis ED.  Author information: (1)Department of Urology, [facility] [place], [place], [address] [place] [address], [place]. [email or web address]  CONTEXT: Despite a stage-shift to earlier cancer stages and lower tumor volumes for prostate cancer, pathologically advanced disease is detected at radical prostatectomy in 38% to 52% of patients. However, the optimal  management of these patients after radical prostatectomy is unknown.  OBJECTIVE: To determine whether adjuvant radiotherapy improves metastasis-free survival in patients with stage pT3 N0 M0 prostate cancer.  DESIGN, SETTING, AND PATIENTS: Randomized, prospective, multi-institutional, [place] clinical trial with enrollment between October 26, 1986, and March 14, 1995 (with database frozen for statistical analysis on December 02, 2003). Patients were 13 men with pathologically advanced prostate cancer who had undergone radical prostatectomy. INTERVENTION: Men were randomly assigned to receive 60 to 64 Gy of external beam radiotherapy delivered to the prostatic fossa (n = 214) or usual care plus observation (n = 211).  MAIN OUTCOME MEASURES: Primary outcome was metastasis-free survival, defined as time to first occurrence of metastatic disease or death due to any cause. Secondary outcomes included prostate-specific antigen (PSA) relapse, recurrence-free survival, overall survival, freedom from hormonal therapy, and postoperative complications.  RESULTS: Among the 425 men, median follow-up was 10.6 years (interquartile range, 9.2-12.7 years). For metastasis-free survival, 76 (35.5%) of 214 men in the adjuvant radiotherapy group were diagnosed with metastatic disease or died (median metastasis-free estimate, 14.7 years), compared with 91 (43.1%) of 211 (median metastasis-free estimate, 13.2 years) of those in the observation group (hazard ratio [HR], 0.75; 95% CI, 0.55-1.02; P = .06). There were no significant between-group differences for overall survival (71 deaths, median survival of 14.7 years for radiotherapy vs 83 deaths, median survival of 13.8 years for observation; HR, 0.80; 95% CI, 0.58-1.09; P = .16). PSA relapse (median PSA relapse-free survival, 10.3 years for radiotherapy vs 3.1 years for observation; HR, 0.43; 95% CI, 0.31-0.58; P<.001) and disease recurrence (median recurrence-free survival, 13.8 years  for radiotherapy vs 9.9 years for observation; HR, 0.62; 95% CI, 0.46-0.82; P = .001) were both significantly reduced with radiotherapy. Adverse effects were more common with radiotherapy vs observation (23.8% vs 11.9%), including rectal complications (9.6% vs 0%), urethral strictures (17.8% vs 9.5%), and total urinary incontinence (6.5% vs 2.8%).  CONCLUSIONS: In men who had undergone radical prostatectomy for pathologically advanced prostate cancer, adjuvant radiotherapy resulted in significantly reduced risk of PSA relapse and disease recurrence, although the improvements in metastasis-free survival and overall survival were not statistically significant.  Trial Registration clinicaltrials.gov Identifier: [id number]. PMID: [id number]   J Clin Oncol. 2007 Jun 1;25(16):2225-9. Predominant treatment failure in postprostatectomy patients is local: analysis of patterns of treatment failure in SWOG 8794.  Swanson GP(1), Winfield MA, Tangen CM, Lora Paula,  [place], Forman JD, Doy Hutching ED;  Author information: (1)Department of Radiation Oncology and Urology, [facility], [place] [address], [place]. [email or web address] Comment in J Clin Oncol. 2007 Dec 10;25(35):5671-2.  PURPOSE: [employer] [facility] (SWOG) trial [id number] demonstrated that adjuvant radiation reduces the risk of biochemical (prostate-specific antigen [PSA]) treatment failure by 50% over radical prostatectomy alone. In this analysis, we stratified patients as to their preradiation PSA levels and correlated it with outcomes such as PSA treatment failure, local recurrence, and distant failure, to serve as guidelines for future research.  PATIENTS AND METHODS: Four hundred thirty-one subjects with pathologically advanced prostate cancer (extraprostatic extension, positive surgical margins, or seminal vesicle invasion) were randomly assigned to adjuvant radiotherapy or observation.  RESULTS:  Three hundred seventy-four eligible patients had immediate postprostatectomy and follow-up PSA data. Median follow-up was 10.2 years. For patients with a postsurgical PSA of 0.2 ng/mL, radiation was associated with reductions in the 10-year risk of biochemical treatment failure (72% to 42%), local failures (20% to 7%), and distant failures (12% to 4%). For patients with a postsurgical PSA between higher than 0.2 and <or = 1.0 ng/mL, reductions in the 10-year risk of biochemical failure (80% to 73%), local failures (25% to 9%), and distant failures (16% to 12%) were realized. In patients with postsurgical PSA higher than 1.0, the respective findings were 94% versus 100%, 28% versus 9%, and 44% versus 18%.  CONCLUSION: The pattern of treatment failure in high-risk patients is predominantly local with a surprisingly low incidence of metastatic failure. Adjuvant radiation to the prostate bed reduces the risk of metastatic disease and biochemical failure at all postsurgical PSA levels. Further improvement in reducing local treatment failure is likely to have the greatest impact on outcome in high-risk patients after prostatectomy.  PMID: [id number]     J Urol. 2009 Mar;181(3):956-62.  Adjuvant radiotherapy for pathological T3N0M0 prostate cancer significantly reduces risk of metastases and improves survival: long-term followup of a randomized clinical trial.  Grandville Silos IM(1), Tangen CM, Hettie Holstein MS, Ova Freshwater, Messing Wayland Denis ED.  Chief Strategy Officer information: (1)[employer] [facility] [place], [place], [place], [place]. PURPOSE: Extraprostatic disease will be manifest in a third of men after radical prostatectomy. We present the long-term followup of a randomized clinical trial of radiotherapy to reduce the risk of subsequent metastatic disease and death.  MATERIALS AND METHODS: A total of 431 men with pT3N0M0 prostate cancer were  randomized to 60 to 64 Gy adjuvant radiotherapy or observation. The primary study end point was metastasis-free survival.  RESULTS: Of 425 eligible men 211 were randomized to observation and 214 to adjuvant radiation. Of those men under observation 70 ultimately received radiotherapy. Metastasis-free survival was significantly greater with radiotherapy (93 of 214 events on the radiotherapy arm vs 114 of 211 events on observation; HR 0.71; 95% CI 0.54, 0.94; p = 0.016). Survival improved significantly with adjuvant radiation (88 deaths of 214 on the radiotherapy arm vs 110 deaths of 211 on observation; HR 0.72; 95% CI 0.55, 0.96; p = 0.023).  CONCLUSIONS: Adjuvant radiotherapy after radical prostatectomy for a man with pT3N0M0 prostate cancer significantly reduces the risk of metastasis and increases survival.  PMCID: [id number] PMID: [id number]

## 2017-10-18 NOTE — Progress Notes (Signed)
  Radiation Oncology         (336) [phone] ________________________________  Name: Marc Ellis MRN: [id number]  Date: 10/19/2017  DOB: 1965/12/07  SIMULATION AND TREATMENT PLANNING NOTE    ICD-10-CM   1. Prostate cancer [facility]) C61     DIAGNOSIS:  52 y.o. gentleman with rising detectable PSA at 0.073, 2 years status post RALP with BPLND for pT2cN0, Gleason 4+3, adenocarcinoma of the prostate  NARRATIVE:  The patient was brought to the [place].  Identity was confirmed.  All relevant records and images related to the planned course of therapy were reviewed.  The patient freely provided informed written consent to proceed with treatment after reviewing the details related to the planned course of therapy. The consent form was witnessed and verified by the simulation staff.  Then, the patient was set-up in a stable reproducible supine position for radiation therapy.  A vacuum lock pillow device was custom fabricated to position his legs in a reproducible immobilized position.  Then, I performed a urethrogram under sterile conditions to identify the prostatic apex.  CT images were obtained.  Surface markings were placed.  The CT images were loaded into the planning software.  Then the prostate target and avoidance structures including the rectum, bladder, bowel and hips were contoured.  Treatment planning then occurred.  The radiation prescription was entered and confirmed.  A total of one complex treatment devices was fabricated. I have requested : Intensity Modulated Radiotherapy (IMRT) is medically necessary for this case for the following reason:  Rectal sparing.Marland Kitchen  PLAN:  The patient will receive 68.4 Gy in 38 fractions.  ________________________________  Sheral Apley Tammi Klippel, M.D.

## 2017-10-19 ENCOUNTER — Ambulatory Visit
Admission: RE | Admit: 2017-10-19 | Discharge: 2017-10-19 | Disposition: A | Discharge status: Home / Self Care | Payer: PRIVATE HEALTH INSURANCE | Source: Ambulatory Visit | Attending: Radiation Oncology | Admitting: Radiation Oncology

## 2017-10-19 DIAGNOSIS — C61 Malignant neoplasm of prostate: Secondary | ICD-10-CM | POA: Diagnosis not present

## 2017-10-19 DIAGNOSIS — Z510 Encounter for antineoplastic radiation therapy: Secondary | ICD-10-CM | POA: Diagnosis present

## 2017-10-24 DIAGNOSIS — Z510 Encounter for antineoplastic radiation therapy: Secondary | ICD-10-CM | POA: Diagnosis not present

## 2017-10-25 ENCOUNTER — Ambulatory Visit
Admission: RE | Admit: 2017-10-25 | Discharge: 2017-10-25 | Disposition: A | Discharge status: Home / Self Care | Payer: PRIVATE HEALTH INSURANCE | Source: Ambulatory Visit | Attending: Radiation Oncology | Admitting: Radiation Oncology

## 2017-10-25 ENCOUNTER — Encounter: Payer: Self-pay | Admitting: Medical Oncology

## 2017-10-25 DIAGNOSIS — Z510 Encounter for antineoplastic radiation therapy: Secondary | ICD-10-CM | POA: Diagnosis not present

## 2017-10-26 ENCOUNTER — Ambulatory Visit
Admission: RE | Admit: 2017-10-26 | Discharge: 2017-10-26 | Disposition: A | Discharge status: Home / Self Care | Payer: PRIVATE HEALTH INSURANCE | Source: Ambulatory Visit | Attending: Radiation Oncology | Admitting: Radiation Oncology

## 2017-10-26 DIAGNOSIS — Z510 Encounter for antineoplastic radiation therapy: Secondary | ICD-10-CM | POA: Diagnosis not present

## 2017-10-29 ENCOUNTER — Ambulatory Visit
Admission: RE | Admit: 2017-10-29 | Discharge: 2017-10-29 | Disposition: A | Discharge status: Home / Self Care | Payer: PRIVATE HEALTH INSURANCE | Source: Ambulatory Visit | Attending: Radiation Oncology | Admitting: Radiation Oncology

## 2017-10-29 DIAGNOSIS — Z510 Encounter for antineoplastic radiation therapy: Secondary | ICD-10-CM | POA: Diagnosis not present

## 2017-10-30 ENCOUNTER — Ambulatory Visit
Admission: RE | Admit: 2017-10-30 | Discharge: 2017-10-30 | Disposition: A | Discharge status: Home / Self Care | Payer: PRIVATE HEALTH INSURANCE | Source: Ambulatory Visit | Attending: Radiation Oncology | Admitting: Radiation Oncology

## 2017-10-30 DIAGNOSIS — Z510 Encounter for antineoplastic radiation therapy: Secondary | ICD-10-CM | POA: Diagnosis not present

## 2017-10-31 ENCOUNTER — Ambulatory Visit
Admission: RE | Admit: 2017-10-31 | Discharge: 2017-10-31 | Disposition: A | Discharge status: Home / Self Care | Payer: PRIVATE HEALTH INSURANCE | Source: Ambulatory Visit | Attending: Radiation Oncology | Admitting: Radiation Oncology

## 2017-10-31 DIAGNOSIS — Z510 Encounter for antineoplastic radiation therapy: Secondary | ICD-10-CM | POA: Diagnosis not present

## 2017-11-01 ENCOUNTER — Ambulatory Visit
Admission: RE | Admit: 2017-11-01 | Discharge: 2017-11-01 | Disposition: A | Discharge status: Home / Self Care | Payer: PRIVATE HEALTH INSURANCE | Source: Ambulatory Visit | Attending: Radiation Oncology | Admitting: Radiation Oncology

## 2017-11-01 DIAGNOSIS — Z510 Encounter for antineoplastic radiation therapy: Secondary | ICD-10-CM | POA: Diagnosis not present

## 2017-11-02 ENCOUNTER — Ambulatory Visit
Admission: RE | Admit: 2017-11-02 | Discharge: 2017-11-02 | Disposition: A | Discharge status: Home / Self Care | Payer: PRIVATE HEALTH INSURANCE | Source: Ambulatory Visit | Attending: Radiation Oncology | Admitting: Radiation Oncology

## 2017-11-02 DIAGNOSIS — Z510 Encounter for antineoplastic radiation therapy: Secondary | ICD-10-CM | POA: Diagnosis not present

## 2017-11-05 ENCOUNTER — Ambulatory Visit
Admission: RE | Admit: 2017-11-05 | Discharge: 2017-11-05 | Disposition: A | Discharge status: Home / Self Care | Payer: PRIVATE HEALTH INSURANCE | Source: Ambulatory Visit | Attending: Radiation Oncology | Admitting: Radiation Oncology

## 2017-11-05 DIAGNOSIS — Z510 Encounter for antineoplastic radiation therapy: Secondary | ICD-10-CM | POA: Diagnosis not present

## 2017-11-06 ENCOUNTER — Ambulatory Visit
Admission: RE | Admit: 2017-11-06 | Discharge: 2017-11-06 | Disposition: A | Discharge status: Home / Self Care | Payer: PRIVATE HEALTH INSURANCE | Source: Ambulatory Visit | Attending: Radiation Oncology | Admitting: Radiation Oncology

## 2017-11-06 ENCOUNTER — Telehealth: Payer: Self-pay | Admitting: Radiation Oncology

## 2017-11-06 ENCOUNTER — Other Ambulatory Visit: Payer: Self-pay | Admitting: Radiation Oncology

## 2017-11-06 ENCOUNTER — Other Ambulatory Visit: Payer: Self-pay

## 2017-11-06 DIAGNOSIS — R300 Dysuria: Secondary | ICD-10-CM

## 2017-11-06 DIAGNOSIS — Z510 Encounter for antineoplastic radiation therapy: Secondary | ICD-10-CM | POA: Diagnosis not present

## 2017-11-06 LAB — URINALYSIS, COMPLETE (UACMP) WITH MICROSCOPIC
BILIRUBIN URINE: NEGATIVE
Bacteria, UA: NONE SEEN
GLUCOSE, UA: NEGATIVE mg/dL
KETONES UR: NEGATIVE mg/dL
LEUKOCYTES UA: NEGATIVE
NITRITE: NEGATIVE
PH: 6 (ref 5.0–8.0)
Protein, ur: NEGATIVE mg/dL
Specific Gravity, Urine: 1.005 (ref 1.005–1.030)

## 2017-11-06 NOTE — Telephone Encounter (Signed)
Phoned patient as requested by Freeman Caldron, PA-C and informed him his urinalysis is negative for an infection. Explained his urine culture results will not be back until Thursday and this RN will call at that time. Instructed patient to continue AZO as directed by Ashlyn until that time. Patient verbalized understanding of all reviewed.

## 2017-11-07 ENCOUNTER — Ambulatory Visit
Admission: RE | Admit: 2017-11-07 | Discharge: 2017-11-07 | Disposition: A | Discharge status: Home / Self Care | Payer: PRIVATE HEALTH INSURANCE | Source: Ambulatory Visit | Attending: Radiation Oncology | Admitting: Radiation Oncology

## 2017-11-07 DIAGNOSIS — Z510 Encounter for antineoplastic radiation therapy: Secondary | ICD-10-CM | POA: Diagnosis not present

## 2017-11-07 LAB — URINE CULTURE: CULTURE: NO GROWTH

## 2017-11-08 ENCOUNTER — Telehealth: Payer: Self-pay | Admitting: Radiation Oncology

## 2017-11-08 ENCOUNTER — Ambulatory Visit
Admission: RE | Admit: 2017-11-08 | Discharge: 2017-11-08 | Disposition: A | Discharge status: Home / Self Care | Payer: PRIVATE HEALTH INSURANCE | Source: Ambulatory Visit | Attending: Radiation Oncology | Admitting: Radiation Oncology

## 2017-11-08 DIAGNOSIS — Z510 Encounter for antineoplastic radiation therapy: Secondary | ICD-10-CM | POA: Diagnosis not present

## 2017-11-08 NOTE — Telephone Encounter (Signed)
Phoned patient as requested by Freeman Caldron, PA-C. Explained his urine culture was negative for an infection. Patient confirms he continues to have dysuria. Instructed patient to continue AZO as directed by [employer]. Explained the negative culture confirms the dysuria is related to the radiation therapy. Encouraged patient to drink plenty of water and phone with any future needs. Patient verbalized understanding of all reviewed.

## 2017-11-09 ENCOUNTER — Ambulatory Visit
Admission: RE | Admit: 2017-11-09 | Discharge: 2017-11-09 | Disposition: A | Discharge status: Home / Self Care | Payer: PRIVATE HEALTH INSURANCE | Source: Ambulatory Visit | Attending: Radiation Oncology | Admitting: Radiation Oncology

## 2017-11-09 DIAGNOSIS — Z510 Encounter for antineoplastic radiation therapy: Secondary | ICD-10-CM | POA: Diagnosis not present

## 2017-11-13 ENCOUNTER — Ambulatory Visit
Admission: RE | Admit: 2017-11-13 | Discharge: 2017-11-13 | Disposition: A | Discharge status: Home / Self Care | Payer: PRIVATE HEALTH INSURANCE | Source: Ambulatory Visit | Attending: Radiation Oncology | Admitting: Radiation Oncology

## 2017-11-13 DIAGNOSIS — C61 Malignant neoplasm of prostate: Secondary | ICD-10-CM | POA: Diagnosis not present

## 2017-11-13 DIAGNOSIS — Z510 Encounter for antineoplastic radiation therapy: Secondary | ICD-10-CM | POA: Insufficient documentation

## 2017-11-13 NOTE — Progress Notes (Signed)
Marc Ellis here for his first radiation treatment. I met him in [facility] 06/25/2015. He had robotic prostatectomy 08/02/15 and now has a rising PSA. He is doing well and ready to beat this cancer one more time.

## 2017-11-14 ENCOUNTER — Ambulatory Visit
Admission: RE | Admit: 2017-11-14 | Discharge: 2017-11-14 | Disposition: A | Discharge status: Home / Self Care | Payer: PRIVATE HEALTH INSURANCE | Source: Ambulatory Visit | Attending: Radiation Oncology | Admitting: Radiation Oncology

## 2017-11-14 DIAGNOSIS — Z510 Encounter for antineoplastic radiation therapy: Secondary | ICD-10-CM | POA: Diagnosis not present

## 2017-11-15 ENCOUNTER — Ambulatory Visit
Admission: RE | Admit: 2017-11-15 | Discharge: 2017-11-15 | Disposition: A | Discharge status: Home / Self Care | Payer: PRIVATE HEALTH INSURANCE | Source: Ambulatory Visit | Attending: Radiation Oncology | Admitting: Radiation Oncology

## 2017-11-15 DIAGNOSIS — Z510 Encounter for antineoplastic radiation therapy: Secondary | ICD-10-CM | POA: Diagnosis not present

## 2017-11-16 ENCOUNTER — Ambulatory Visit
Admission: RE | Admit: 2017-11-16 | Discharge: 2017-11-16 | Disposition: A | Discharge status: Home / Self Care | Payer: PRIVATE HEALTH INSURANCE | Source: Ambulatory Visit | Attending: Radiation Oncology | Admitting: Radiation Oncology

## 2017-11-16 DIAGNOSIS — Z510 Encounter for antineoplastic radiation therapy: Secondary | ICD-10-CM | POA: Diagnosis not present

## 2017-11-19 ENCOUNTER — Ambulatory Visit
Admission: RE | Admit: 2017-11-19 | Discharge: 2017-11-19 | Disposition: A | Discharge status: Home / Self Care | Payer: PRIVATE HEALTH INSURANCE | Source: Ambulatory Visit | Attending: Radiation Oncology | Admitting: Radiation Oncology

## 2017-11-19 DIAGNOSIS — Z510 Encounter for antineoplastic radiation therapy: Secondary | ICD-10-CM | POA: Diagnosis not present

## 2017-11-20 ENCOUNTER — Ambulatory Visit
Admission: RE | Admit: 2017-11-20 | Discharge: 2017-11-20 | Disposition: A | Discharge status: Home / Self Care | Payer: PRIVATE HEALTH INSURANCE | Source: Ambulatory Visit | Attending: Radiation Oncology | Admitting: Radiation Oncology

## 2017-11-20 DIAGNOSIS — Z510 Encounter for antineoplastic radiation therapy: Secondary | ICD-10-CM | POA: Diagnosis not present

## 2017-11-21 ENCOUNTER — Ambulatory Visit
Admission: RE | Admit: 2017-11-21 | Discharge: 2017-11-21 | Disposition: A | Discharge status: Home / Self Care | Payer: PRIVATE HEALTH INSURANCE | Source: Ambulatory Visit | Attending: Radiation Oncology | Admitting: Radiation Oncology

## 2017-11-21 DIAGNOSIS — Z510 Encounter for antineoplastic radiation therapy: Secondary | ICD-10-CM | POA: Diagnosis not present

## 2017-11-22 ENCOUNTER — Ambulatory Visit
Admission: RE | Admit: 2017-11-22 | Discharge: 2017-11-22 | Disposition: A | Discharge status: Home / Self Care | Payer: PRIVATE HEALTH INSURANCE | Source: Ambulatory Visit | Attending: Radiation Oncology | Admitting: Radiation Oncology

## 2017-11-22 DIAGNOSIS — Z510 Encounter for antineoplastic radiation therapy: Secondary | ICD-10-CM | POA: Diagnosis not present

## 2017-11-23 ENCOUNTER — Ambulatory Visit
Admission: RE | Admit: 2017-11-23 | Discharge: 2017-11-23 | Disposition: A | Discharge status: Home / Self Care | Payer: PRIVATE HEALTH INSURANCE | Source: Ambulatory Visit | Attending: Radiation Oncology | Admitting: Radiation Oncology

## 2017-11-23 DIAGNOSIS — Z510 Encounter for antineoplastic radiation therapy: Secondary | ICD-10-CM | POA: Diagnosis not present

## 2017-11-26 ENCOUNTER — Ambulatory Visit
Admission: RE | Admit: 2017-11-26 | Discharge: 2017-11-26 | Disposition: A | Discharge status: Home / Self Care | Payer: PRIVATE HEALTH INSURANCE | Source: Ambulatory Visit | Attending: Radiation Oncology | Admitting: Radiation Oncology

## 2017-11-26 DIAGNOSIS — Z510 Encounter for antineoplastic radiation therapy: Secondary | ICD-10-CM | POA: Diagnosis not present

## 2017-11-27 ENCOUNTER — Ambulatory Visit
Admission: RE | Admit: 2017-11-27 | Discharge: 2017-11-27 | Disposition: A | Discharge status: Home / Self Care | Payer: PRIVATE HEALTH INSURANCE | Source: Ambulatory Visit | Attending: Radiation Oncology | Admitting: Radiation Oncology

## 2017-11-27 DIAGNOSIS — Z510 Encounter for antineoplastic radiation therapy: Secondary | ICD-10-CM | POA: Diagnosis not present

## 2017-11-28 ENCOUNTER — Ambulatory Visit
Admission: RE | Admit: 2017-11-28 | Discharge: 2017-11-28 | Disposition: A | Discharge status: Home / Self Care | Payer: PRIVATE HEALTH INSURANCE | Source: Ambulatory Visit | Attending: Radiation Oncology | Admitting: Radiation Oncology

## 2017-11-28 DIAGNOSIS — Z510 Encounter for antineoplastic radiation therapy: Secondary | ICD-10-CM | POA: Diagnosis not present

## 2017-11-29 ENCOUNTER — Ambulatory Visit
Admission: RE | Admit: 2017-11-29 | Discharge: 2017-11-29 | Disposition: A | Discharge status: Home / Self Care | Payer: PRIVATE HEALTH INSURANCE | Source: Ambulatory Visit | Attending: Radiation Oncology | Admitting: Radiation Oncology

## 2017-11-29 DIAGNOSIS — Z510 Encounter for antineoplastic radiation therapy: Secondary | ICD-10-CM | POA: Diagnosis not present

## 2017-11-30 ENCOUNTER — Encounter: Payer: Self-pay | Admitting: Medical Oncology

## 2017-11-30 ENCOUNTER — Ambulatory Visit
Admission: RE | Admit: 2017-11-30 | Discharge: 2017-11-30 | Disposition: A | Discharge status: Home / Self Care | Payer: PRIVATE HEALTH INSURANCE | Source: Ambulatory Visit | Attending: Radiation Oncology | Admitting: Radiation Oncology

## 2017-11-30 DIAGNOSIS — Z510 Encounter for antineoplastic radiation therapy: Secondary | ICD-10-CM | POA: Diagnosis not present

## 2017-11-30 NOTE — Progress Notes (Signed)
Mr. Zorn states he is doing well with radiation. His main compliant has been urinary symptoms. He has been taking AZO which has helped. No major complaints today.

## 2017-12-03 ENCOUNTER — Ambulatory Visit
Admission: RE | Admit: 2017-12-03 | Discharge: 2017-12-03 | Disposition: A | Discharge status: Home / Self Care | Payer: PRIVATE HEALTH INSURANCE | Source: Ambulatory Visit | Attending: Radiation Oncology | Admitting: Radiation Oncology

## 2017-12-03 DIAGNOSIS — Z510 Encounter for antineoplastic radiation therapy: Secondary | ICD-10-CM | POA: Diagnosis not present

## 2017-12-04 ENCOUNTER — Ambulatory Visit
Admission: RE | Admit: 2017-12-04 | Discharge: 2017-12-04 | Disposition: A | Discharge status: Home / Self Care | Payer: PRIVATE HEALTH INSURANCE | Source: Ambulatory Visit | Attending: Radiation Oncology | Admitting: Radiation Oncology

## 2017-12-04 DIAGNOSIS — Z510 Encounter for antineoplastic radiation therapy: Secondary | ICD-10-CM | POA: Diagnosis not present

## 2017-12-05 ENCOUNTER — Ambulatory Visit
Admission: RE | Admit: 2017-12-05 | Discharge: 2017-12-05 | Disposition: A | Discharge status: Home / Self Care | Payer: PRIVATE HEALTH INSURANCE | Source: Ambulatory Visit | Attending: Radiation Oncology | Admitting: Radiation Oncology

## 2017-12-05 DIAGNOSIS — Z510 Encounter for antineoplastic radiation therapy: Secondary | ICD-10-CM | POA: Diagnosis not present

## 2017-12-06 ENCOUNTER — Ambulatory Visit
Admission: RE | Admit: 2017-12-06 | Discharge: 2017-12-06 | Disposition: A | Discharge status: Home / Self Care | Payer: PRIVATE HEALTH INSURANCE | Source: Ambulatory Visit | Attending: Radiation Oncology | Admitting: Radiation Oncology

## 2017-12-06 DIAGNOSIS — Z510 Encounter for antineoplastic radiation therapy: Secondary | ICD-10-CM | POA: Diagnosis not present

## 2017-12-07 ENCOUNTER — Ambulatory Visit
Admission: RE | Admit: 2017-12-07 | Discharge: 2017-12-07 | Disposition: A | Discharge status: Home / Self Care | Payer: PRIVATE HEALTH INSURANCE | Source: Ambulatory Visit | Attending: Radiation Oncology | Admitting: Radiation Oncology

## 2017-12-07 DIAGNOSIS — Z510 Encounter for antineoplastic radiation therapy: Secondary | ICD-10-CM | POA: Diagnosis not present

## 2017-12-10 ENCOUNTER — Ambulatory Visit
Admission: RE | Admit: 2017-12-10 | Discharge: 2017-12-10 | Disposition: A | Discharge status: Home / Self Care | Payer: PRIVATE HEALTH INSURANCE | Source: Ambulatory Visit | Attending: Radiation Oncology | Admitting: Radiation Oncology

## 2017-12-10 DIAGNOSIS — Z510 Encounter for antineoplastic radiation therapy: Secondary | ICD-10-CM | POA: Diagnosis not present

## 2017-12-11 ENCOUNTER — Encounter: Payer: Self-pay | Admitting: Medical Oncology

## 2017-12-11 ENCOUNTER — Ambulatory Visit
Admission: RE | Admit: 2017-12-11 | Discharge: 2017-12-11 | Disposition: A | Discharge status: Home / Self Care | Payer: PRIVATE HEALTH INSURANCE | Source: Ambulatory Visit | Attending: Radiation Oncology | Admitting: Radiation Oncology

## 2017-12-11 DIAGNOSIS — Z510 Encounter for antineoplastic radiation therapy: Secondary | ICD-10-CM | POA: Diagnosis not present

## 2017-12-11 DIAGNOSIS — C61 Malignant neoplasm of prostate: Secondary | ICD-10-CM | POA: Diagnosis not present

## 2017-12-12 ENCOUNTER — Ambulatory Visit
Admission: RE | Admit: 2017-12-12 | Discharge: 2017-12-12 | Disposition: A | Discharge status: Home / Self Care | Payer: PRIVATE HEALTH INSURANCE | Source: Ambulatory Visit | Attending: Radiation Oncology | Admitting: Radiation Oncology

## 2017-12-12 DIAGNOSIS — Z510 Encounter for antineoplastic radiation therapy: Secondary | ICD-10-CM | POA: Diagnosis not present

## 2017-12-13 ENCOUNTER — Ambulatory Visit
Admission: RE | Admit: 2017-12-13 | Discharge: 2017-12-13 | Disposition: A | Discharge status: Home / Self Care | Payer: PRIVATE HEALTH INSURANCE | Source: Ambulatory Visit | Attending: Radiation Oncology | Admitting: Radiation Oncology

## 2017-12-13 DIAGNOSIS — Z510 Encounter for antineoplastic radiation therapy: Secondary | ICD-10-CM | POA: Diagnosis not present

## 2017-12-14 ENCOUNTER — Ambulatory Visit
Admission: RE | Admit: 2017-12-14 | Discharge: 2017-12-14 | Disposition: A | Discharge status: Home / Self Care | Payer: PRIVATE HEALTH INSURANCE | Source: Ambulatory Visit | Attending: Radiation Oncology | Admitting: Radiation Oncology

## 2017-12-14 DIAGNOSIS — Z510 Encounter for antineoplastic radiation therapy: Secondary | ICD-10-CM | POA: Diagnosis not present

## 2017-12-17 ENCOUNTER — Ambulatory Visit
Admission: RE | Admit: 2017-12-17 | Discharge: 2017-12-17 | Disposition: A | Discharge status: Home / Self Care | Payer: PRIVATE HEALTH INSURANCE | Source: Ambulatory Visit | Attending: Radiation Oncology | Admitting: Radiation Oncology

## 2017-12-17 DIAGNOSIS — Z510 Encounter for antineoplastic radiation therapy: Secondary | ICD-10-CM | POA: Diagnosis not present

## 2017-12-18 ENCOUNTER — Other Ambulatory Visit: Payer: Self-pay | Admitting: Urology

## 2017-12-18 ENCOUNTER — Encounter: Payer: Self-pay | Admitting: Radiation Oncology

## 2017-12-18 ENCOUNTER — Ambulatory Visit
Admission: RE | Admit: 2017-12-18 | Discharge: 2017-12-18 | Disposition: A | Discharge status: Home / Self Care | Payer: PRIVATE HEALTH INSURANCE | Source: Ambulatory Visit | Attending: Radiation Oncology | Admitting: Radiation Oncology

## 2017-12-18 DIAGNOSIS — Z510 Encounter for antineoplastic radiation therapy: Secondary | ICD-10-CM | POA: Diagnosis not present

## 2017-12-18 MED ORDER — HYDROCORTISONE ACETATE 25 MG RE SUPP: 25.0000 mg | Freq: Two times a day (BID) | RECTAL | 0 refills | Status: DC | PRN

## 2017-12-18 NOTE — Progress Notes (Signed)
Received patient in the nursing clinic following his final radiation treatment. Patient reports new onset blood in stool x 2 days. Weight and vitals stable. Patient afebrile. Explained this is an expected side effect associated with radiation therapy. Patient verbalized understanding. Informed Ashlyn Bruning, PA-C of these findings. Per Ashlyn's request I explained to the patient hydrocortisone suppositories will be escribed to [facility], [facility]. Patient verbalized understanding. One month follow up appointment card given.

## 2017-12-20 NOTE — Progress Notes (Signed)
  Radiation Oncology         (336) [phone] ________________________________  Name: Marc Ellis MRN: [id number]  Date: 12/18/2017  DOB: March 19, 1965  End of Treatment Note  Diagnosis:   52 y.o. male with rising detectable PSA at 0.073, 2 years status post RALP with BPLND for pT2cN0, Gleason 4+3, adenocarcinoma of the prostate    Indication for treatment:  Curative, Salvage Prostatic Fossa Radiotherapy       Radiation treatment dates:   10/25/2017 - 12/18/2017  Site/dose:   The prostatic fossa was treated to 68.4 Gy in 38 fractions of 1.8 Gy  Beams/energy:   The prostatic fossa was treated using helical intensity modulated radiotherapy delivering 6 megavolt photons. Image guidance was performed with megavoltage CT studies prior to each fraction. He was immobilized with a body fix lower extremity mold.  Narrative: The patient tolerated radiation treatment relatively well.  He experienced moderate fatigue and some minor urinary irritation with nocturia x1 and dysuria, managed with Azo. He denied any hematuria. He did also have some diarrhea and rectal irritation towards the end of treatment.  Plan: The patient has completed radiation treatment. He will return to radiation oncology clinic for routine followup in one month. I advised him to call or return sooner if he has any questions or concerns related to his recovery or treatment. ________________________________  Sheral Apley. Tammi Klippel, M.D.

## 2017-12-24 ENCOUNTER — Telehealth: Payer: Self-pay | Admitting: Radiation Oncology

## 2017-12-24 NOTE — Telephone Encounter (Signed)
Received voicemail message from patient requesting return call. Phoned patient back. Patient questions if it "is normal to continue to see bright red blood in the commode after having a bowel movement." Patient denies passing clots. Patient reports a few episodes of hard stool. Patient denies using suppositories bid as directed. Explained radiation is still working in his system thus bowel/rectal irritation continues.  Encouraged patient to use suppositories as directed (bid) and keep stool soft either with diet or colace. Encouraged patient to phone this RN back should symptoms become worse or he begins to feel weak. Patient verbalized understanding of all reviewed and expressed appreciation for the return call.

## 2018-01-03 NOTE — Progress Notes (Signed)
FMLA successfully faxed to Kandra Nicolas at [phone]. Mailed copy to patient address on file.

## 2018-01-24 ENCOUNTER — Ambulatory Visit
Admission: RE | Admit: 2018-01-24 | Discharge: 2018-01-24 | Disposition: A | Discharge status: Home / Self Care | Payer: PRIVATE HEALTH INSURANCE | Source: Ambulatory Visit | Attending: Urology | Admitting: Urology

## 2018-01-24 ENCOUNTER — Other Ambulatory Visit: Payer: Self-pay

## 2018-01-24 ENCOUNTER — Encounter: Payer: Self-pay | Admitting: Urology

## 2018-01-24 VITALS — BP 136/99 | HR 65 | Temp 97.9°F | Resp 18 | Ht 70.0 in | Wt 246.2 lb

## 2018-01-24 DIAGNOSIS — C61 Malignant neoplasm of prostate: Secondary | ICD-10-CM | POA: Diagnosis present

## 2018-01-24 DIAGNOSIS — Z79899 Other long term (current) drug therapy: Secondary | ICD-10-CM | POA: Diagnosis not present

## 2018-01-24 NOTE — Progress Notes (Signed)
Radiation Oncology         [phone] ________________________________  Name: Marc Ellis MRN: [id number]  Date: 01/24/2018  DOB: June 13, 1965  Post Treatment Note  CC: Ardelle Anton, MD  Raynelle Bring, MD  Diagnosis:   52 y.o. male with rising detectable PSA at 0.073, 2 years status post RALP with BPLND for pT2cN0, Gleason 4+3, adenocarcinoma of the prostate    Interval Since Last Radiation:  5 weeks  10/25/2017 - 12/18/2017:  The prostatic fossa was treated to 68.4 Gy in 38 fractions of 1.8 Gy  Narrative:  The patient returns today for routine follow-up.  He tolerated radiation treatment relatively well.  He experienced moderate fatigue and some minor urinary irritation with nocturia x1 and dysuria, managed with Azo. He denied any hematuria. He did also have some diarrhea and rectal irritation towards the end of treatment.                             On review of systems, the patient states that he is doing very well overall.  He continues with mild intermittent dysuria and is using AZO as needed at this point.  Otherwise, he reports his urinary symptoms have pretty much returned to normal with only mild increased frequency and urgency at this point.  He denies gross hematuria, weak stream, straining, incomplete emptying or incontinence.  His current IPSS score is 2 indicating mild urinary symptoms only.  He denies abdominal pain, nausea, vomiting, diarrhea or constipation. 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  Additionally, he developed some significant fatigue towards the end of treatment requiring him to take some time off of work.  He reports that this did him a world of good and he has been back to work FT over this past week and feeling back to 100%.  Overall, he is quite pleased with his progress to date.  ALLERGIES:  has No  Known Allergies.  Meds: Current Outpatient Medications  Medication Sig Dispense Refill  . acetaminophen (TYLENOL) 500 MG tablet Take 1,000 mg by mouth every 6 (six) hours as needed (For pain.).    Marland Kitchen atorvastatin (LIPITOR) 10 MG tablet Take 10 mg by mouth at bedtime.     Marland Kitchen azelastine (ASTELIN) 0.1 % nasal spray USE 2 (TWO) SPRAYS EACH NOSTRIL, AT BEDTIME    . cetirizine (ZYRTEC) 10 MG tablet Take 10 mg by mouth daily.    . hydrocortisone (ANUSOL-HC) 25 MG suppository Place 1 suppository (25 mg total) rectally 2 (two) times daily as needed (rectal bleeding- use BID x5 days then prn only). 24 suppository 0  . hydrOXYzine (VISTARIL) 25 MG capsule TAKE 1 CAPSULE BY MOUTH AT BEDTIME FOR ANXIETY AND INSOMNIA - INCREASE TO 2 IF NEEDED    . LORazepam (ATIVAN) 1 MG tablet Take 0.5-1 mg by mouth every 8 (eight) hours as needed for anxiety.     . Melatonin 10 MG CAPS Take by mouth.    . montelukast (SINGULAIR) 10 MG tablet TAKE 1 TABLET BY MOUTH EVERY DAY    . EPINEPHrine 0.3 mg/0.3 mL IJ SOAJ injection Inject 0.3 mg into the muscle once as needed (For anaphylaxis.).    Marland Kitchen fluticasone (FLONASE) 50 MCG/ACT nasal spray 1 spray by Both Nostrils route daily.    . nicotine (NICOTROL) 10 MG inhaler  Inhale 1 continuous puffing into the lungs as needed for smoking cessation.     No current facility-administered medications for this encounter.     Physical Findings:  height is 5\' 10"  (1.778 m) and weight is 246 lb 3.2 oz (111.7 kg). His oral temperature is 97.9 F (36.6 C). His blood pressure is 136/99 (abnormal) and his pulse is 65. His respiration is 18 and oxygen saturation is 99%.  Pain Assessment Pain Score: 0-No pain/10 In general this is a well appearing Caucasian male in no acute distress.  He's alert and oriented x4 and appropriate throughout the examination. Cardiopulmonary assessment is negative for acute distress and he exhibits normal effort.   Lab Findings: Lab Results  Component Value Date    WBC 10.4 07/29/2015   HGB 12.4 (L) 08/03/2015   HCT 36.6 (L) 08/03/2015   MCV 91.6 07/29/2015   PLT 271 07/29/2015     Radiographic Findings: No results found.  Impression/Plan: 1. 52 y.o. male with rising detectable PSA at 0.073, 2 years status post RALP with BPLND for pT2cN0, Gleason 4+3, adenocarcinoma of the prostate. He will continue to follow up with urology for ongoing PSA determinations and has an appointment scheduled with Dr. Alinda Money on 05/08/17. He understands what to expect with regards to PSA monitoring going forward. I will look forward to following his response to treatment via correspondence with urology, and would be happy to continue to participate in his care if clinically indicated. I talked to the patient about what to expect in the future, including his risk for erectile dysfunction and rectal bleeding. I encouraged him to call or return to the office if he has any questions regarding his previous radiation or possible radiation side effects. He was comfortable with this plan and will follow up as needed.    Nicholos Johns, PA-C

## 2019-06-05 ENCOUNTER — Other Ambulatory Visit: Payer: Self-pay | Admitting: Urology

## 2019-06-05 DIAGNOSIS — D49511 Neoplasm of unspecified behavior of right kidney: Secondary | ICD-10-CM

## 2019-07-02 ENCOUNTER — Ambulatory Visit
Admission: RE | Admit: 2019-07-02 | Discharge: 2019-07-02 | Disposition: A | Discharge status: Home / Self Care | Payer: PRIVATE HEALTH INSURANCE | Source: Ambulatory Visit | Attending: Urology | Admitting: Urology

## 2019-07-02 ENCOUNTER — Other Ambulatory Visit: Payer: Self-pay

## 2019-07-02 DIAGNOSIS — D49511 Neoplasm of unspecified behavior of right kidney: Secondary | ICD-10-CM

## 2019-07-02 MED ORDER — GADOBENATE DIMEGLUMINE 529 MG/ML IV SOLN
20.0000 mL | Freq: Once | INTRAVENOUS | Status: AC | PRN
  Administered 2019-07-02: 20 mL via INTRAVENOUS

## 2019-07-29 ENCOUNTER — Other Ambulatory Visit: Payer: Self-pay | Admitting: Urology

## 2019-07-29 DIAGNOSIS — N2889 Other specified disorders of kidney and ureter: Secondary | ICD-10-CM

## 2019-08-01 ENCOUNTER — Other Ambulatory Visit: Payer: Self-pay

## 2019-08-01 ENCOUNTER — Ambulatory Visit (HOSPITAL_COMMUNITY)
Admission: RE | Admit: 2019-08-01 | Discharge: 2019-08-01 | Disposition: A | Discharge status: Home / Self Care | Payer: PRIVATE HEALTH INSURANCE | Source: Ambulatory Visit | Attending: Urology | Admitting: Urology

## 2019-08-01 DIAGNOSIS — N2889 Other specified disorders of kidney and ureter: Secondary | ICD-10-CM | POA: Diagnosis not present

## 2019-08-05 ENCOUNTER — Other Ambulatory Visit (HOSPITAL_COMMUNITY): Payer: Self-pay | Admitting: Urology

## 2019-08-05 ENCOUNTER — Encounter (HOSPITAL_COMMUNITY): Payer: Self-pay | Admitting: Radiology

## 2019-08-05 DIAGNOSIS — N2889 Other specified disorders of kidney and ureter: Secondary | ICD-10-CM

## 2019-08-05 NOTE — Progress Notes (Signed)
Marc Ellis. [facility] Male, 54 y.o., 28-Oct-1965 MRN:  [id number] Phone:  [phone] (M) PCP:  [name], PA-C Coverage:  Medcost/Medcost  RE: US Kidney Biopsy Received: Today Message Contents  Aletta Edouard, MD  Garth Bigness D  Discussed with Dr. Alinda Money and in Urology Conf. Mass visible by [place]. OK for US guided right renal mass biopsy.   GY       Previous Messages   ----- Message -----  From: Garth Bigness D  Sent: 08/05/2019  4:26 PM EDT  To: Aletta Edouard, MD  Subject: US Kidney Biopsy                 Procedure:  US Kidney Biopsy   Reason: right renal neoplasm, right renal mass   History: MR, [place] in computer   Provider: Raynelle Bring   Provider Contact: [phone]

## 2019-08-13 ENCOUNTER — Other Ambulatory Visit: Payer: Self-pay | Admitting: Student

## 2019-08-13 ENCOUNTER — Other Ambulatory Visit: Payer: Self-pay | Admitting: Radiology

## 2019-08-14 ENCOUNTER — Encounter (HOSPITAL_COMMUNITY): Payer: Self-pay

## 2019-08-14 ENCOUNTER — Other Ambulatory Visit: Payer: Self-pay

## 2019-08-14 ENCOUNTER — Ambulatory Visit (HOSPITAL_COMMUNITY)
Admission: RE | Admit: 2019-08-14 | Discharge: 2019-08-14 | Disposition: A | Discharge status: Home / Self Care | Payer: PRIVATE HEALTH INSURANCE | Source: Ambulatory Visit | Attending: Urology | Admitting: Urology

## 2019-08-14 DIAGNOSIS — Z79899 Other long term (current) drug therapy: Secondary | ICD-10-CM | POA: Diagnosis not present

## 2019-08-14 DIAGNOSIS — I10 Essential (primary) hypertension: Secondary | ICD-10-CM | POA: Insufficient documentation

## 2019-08-14 DIAGNOSIS — E785 Hyperlipidemia, unspecified: Secondary | ICD-10-CM | POA: Insufficient documentation

## 2019-08-14 DIAGNOSIS — C641 Malignant neoplasm of right kidney, except renal pelvis: Secondary | ICD-10-CM | POA: Insufficient documentation

## 2019-08-14 DIAGNOSIS — Z8546 Personal history of malignant neoplasm of prostate: Secondary | ICD-10-CM | POA: Diagnosis not present

## 2019-08-14 DIAGNOSIS — N2889 Other specified disorders of kidney and ureter: Secondary | ICD-10-CM

## 2019-08-14 DIAGNOSIS — F419 Anxiety disorder, unspecified: Secondary | ICD-10-CM | POA: Insufficient documentation

## 2019-08-14 DIAGNOSIS — F17210 Nicotine dependence, cigarettes, uncomplicated: Secondary | ICD-10-CM | POA: Diagnosis not present

## 2019-08-14 DIAGNOSIS — Z8042 Family history of malignant neoplasm of prostate: Secondary | ICD-10-CM | POA: Insufficient documentation

## 2019-08-14 DIAGNOSIS — Z8249 Family history of ischemic heart disease and other diseases of the circulatory system: Secondary | ICD-10-CM | POA: Diagnosis not present

## 2019-08-14 LAB — CBC
HCT: 45.2 % (ref 39.0–52.0)
Hemoglobin: 15.3 g/dL (ref 13.0–17.0)
MCH: 30.9 pg (ref 26.0–34.0)
MCHC: 33.8 g/dL (ref 30.0–36.0)
MCV: 91.3 fL (ref 80.0–100.0)
Platelets: 246 10*3/uL (ref 150–400)
RBC: 4.95 MIL/uL (ref 4.22–5.81)
RDW: 13.2 % (ref 11.5–15.5)
WBC: 8 10*3/uL (ref 4.0–10.5)
nRBC: 0 % (ref 0.0–0.2)

## 2019-08-14 LAB — PROTIME-INR
INR: 0.9 (ref 0.8–1.2)
Prothrombin Time: 11.6 seconds (ref 11.4–15.2)

## 2019-08-14 MED ORDER — LIDOCAINE HCL (PF) 1 % IJ SOLN
INTRAMUSCULAR | Status: AC
  Filled 2019-08-14: qty 30

## 2019-08-14 MED ORDER — FENTANYL CITRATE (PF) 100 MCG/2ML IJ SOLN
INTRAMUSCULAR | Status: AC
  Filled 2019-08-14: qty 2

## 2019-08-14 MED ORDER — SODIUM CHLORIDE 0.9 % IV SOLN: INTRAVENOUS | Status: DC

## 2019-08-14 MED ORDER — HYDROCODONE-ACETAMINOPHEN 5-325 MG PO TABS: 1.0000 | ORAL_TABLET | ORAL | Status: DC | PRN

## 2019-08-14 MED ORDER — MIDAZOLAM HCL 2 MG/2ML IJ SOLN
INTRAMUSCULAR | Status: AC
  Filled 2019-08-14: qty 2

## 2019-08-14 MED ORDER — MIDAZOLAM HCL 2 MG/2ML IJ SOLN
INTRAMUSCULAR | Status: AC | PRN
  Administered 2019-08-14: 1 mg via INTRAVENOUS
  Administered 2019-08-14: 2 mg via INTRAVENOUS

## 2019-08-14 MED ORDER — GELATIN ABSORBABLE 12-7 MM EX MISC
CUTANEOUS | Status: AC
  Filled 2019-08-14: qty 1

## 2019-08-14 MED ORDER — FENTANYL CITRATE (PF) 100 MCG/2ML IJ SOLN
INTRAMUSCULAR | Status: AC | PRN
  Administered 2019-08-14: 25 ug via INTRAVENOUS
  Administered 2019-08-14: 50 ug via INTRAVENOUS
  Administered 2019-08-14 (×2): 25 ug via INTRAVENOUS

## 2019-08-14 NOTE — H&P (Signed)
Chief Complaint: Right renal mass  Referring Physician(s): Borden,Lester  Supervising Physician: Jacqulynn Cadet  Patient Status: [facility] - Out-pt  History of Present Illness: Marc Ellis is a 54 y.o. male with history of prostate cancer, s/p robotic prostatectomy in 2017.  Follow up CT scan ordered by urology revealed a right renal mass.  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   He is here today for biopsy.  He is NPO. No blood thinners. No nausea/vomiting. No Fever/chills. ROS negative.   Past Medical History:  Diagnosis Date  . Anxiety   . Elevated PSA   . Family history of prostate cancer   . Hyperlipidemia   . Hypertension    NO MEDS FOR HAS BEEN A LITTLE ELEVATED  . Prostate cancer [facility]) JAN 2017    Past Surgical History:  Procedure Laterality Date  . [place]  . LYMPHADENECTOMY Bilateral 08/02/2015   Procedure: PELVIC LYMPHADENECTOMY;  Surgeon: Raynelle Bring, MD;  Location: [facility] ORS;  Service: Urology;  Laterality: Bilateral;  . PROSTATE BIOPSY    . ROBOT ASSISTED LAPAROSCOPIC RADICAL PROSTATECTOMY N/A 08/02/2015   Procedure: XI ROBOTIC ASSISTED LAPAROSCOPIC RADICAL PROSTATECTOMY LEVEL 2;  Surgeon: Raynelle Bring, MD;  Location: [facility] ORS;  Service: Urology;  Laterality: N/A;    Allergies: Patient has no known allergies.  Medications: Prior to Admission medications   Medication Sig Start Date End Date Taking? Authorizing Provider  acetaminophen (TYLENOL) 500 MG tablet Take 1,000 mg by mouth every 6 (six) hours as needed (For pain.).   Yes [provider]  atorvastatin (LIPITOR) 10 MG tablet Take 10 mg by mouth at bedtime.    Yes [provider]  azelastine (ASTELIN) 0.1 % nasal spray Place 2 sprays into both nostrils at bedtime as needed for allergies.  05/12/16  Yes [provider]    bisoprolol-hydrochlorothiazide (ZIAC) 5-6.25 MG tablet Take 1 tablet by mouth daily. 06/13/19  Yes [provider]  cetirizine (ZYRTEC) 10 MG tablet Take 10 mg by mouth daily.   Yes [provider]  EPINEPHrine 0.3 mg/0.3 mL IJ SOAJ injection Inject 0.3 mg into the muscle once as needed (For anaphylaxis.).   Yes [provider]  fluticasone (FLONASE) 50 MCG/ACT nasal spray Place 1 spray into both nostrils daily as needed for allergies.    Yes [provider]  hydrOXYzine (VISTARIL) 25 MG capsule Take 25 mg by mouth at bedtime.  08/14/17  Yes [provider]  LORazepam (ATIVAN) 1 MG tablet Take 0.5 mg by mouth 2 (two) times daily.    Yes [provider]  Melatonin 10 MG CAPS Take 10 mg by mouth at bedtime as needed (sleep).    Yes [provider]  montelukast (SINGULAIR) 10 MG tablet Take 10 mg by mouth daily.  07/09/17  Yes [provider]  naproxen sodium (ALEVE) 220 MG tablet Take 220 mg by mouth daily as needed (pain).   Yes [provider]  nicotine (NICOTROL) 10 MG inhaler Inhale 1 continuous puffing into the lungs as needed for smoking cessation.   Yes [provider]     Family History  Problem Relation Age of Onset  . Prostate cancer Father 40  . Prostate cancer Paternal Uncle        dx in his 55s  . Kidney failure Sister   . Diabetes Maternal Aunt   .  Hypertension Maternal Grandmother   . Prostate cancer Paternal Grandfather        dx in his 75s    Social History   Socioeconomic History  . Marital status: Widowed    Spouse name: Not on file  . Number of children: 3  . Years of education: Not on file  . Highest education level: Not on file  Occupational History  . Not on file  Tobacco Use  . Smoking status: Current Every Day Smoker    Packs/day: 1.00    Years: 20.00    Pack years: 20.00    Types: Cigarettes  . Smokeless tobacco: Never Used  Substance and Sexual Activity  . Alcohol  use: No  . Drug use: No  . Sexual activity: Not on file  Other Topics Concern  . Not on file  Social History Narrative  . Not on file   Social Determinants of Health   Financial Resource Strain:   . Difficulty of Paying Living Expenses:   Food Insecurity:   . Worried About Charity fundraiser in the Last Year:   . Arboriculturist in the Last Year:   Transportation Needs:   . Film/video editor (Medical):   Marland Kitchen Lack of Transportation (Non-Medical):   Physical Activity:   . Days of Exercise per Week:   . Minutes of Exercise per Session:   Stress:   . Feeling of Stress :   Social Connections:   . Frequency of Communication with Friends and Family:   . Frequency of Social Gatherings with Friends and Family:   . Attends Religious Services:   . Active Member of Clubs or Organizations:   . Attends Archivist Meetings:   Marland Kitchen Marital Status:      Review of Systems: A 12 point ROS discussed and pertinent positives are indicated in the HPI above.  All other systems are negative.  Review of Systems  Vital Signs: BP 124/74   Pulse (!) 58   Temp (!) 97.5 F (36.4 C) (Skin)   Resp 17   Ht 5\' 9"  (1.753 m)   Wt 104.3 kg   SpO2 98%   BMI 33.97 kg/m   Physical Exam Vitals reviewed.  Constitutional:      Appearance: Normal appearance.  HENT:     Head: Normocephalic and atraumatic.  Eyes:     Extraocular Movements: Extraocular movements intact.  Cardiovascular:     Rate and Rhythm: Normal rate and regular rhythm.  Pulmonary:     Effort: Pulmonary effort is normal. No respiratory distress.     Breath sounds: Normal breath sounds.  Abdominal:     General: There is no distension.     Palpations: Abdomen is soft.     Tenderness: There is no abdominal tenderness.  Musculoskeletal:        General: Normal range of motion.     Cervical back: Normal range of motion.  Skin:    General: Skin is warm and dry.  Neurological:     General: No focal deficit present.      Mental Status: He is alert and oriented to person, place, and time.  Psychiatric:        Mood and Affect: Mood normal.        Behavior: Behavior normal.        Thought Content: Thought content normal.        Judgment: Judgment normal.     Imaging: US RENAL  Result Date: 08/01/2019 CLINICAL  DATA:  Right renal mass. EXAM: RENAL / URINARY TRACT ULTRASOUND COMPLETE COMPARISON:  MRI of the abdomen 07/02/2019. FINDINGS: Right Kidney: Renal measurements: 12.1 x 5.3 x 5.0 cm = volume: 160.3 mL . Echogenicity within normal limits. 2.1 x 1.7 x 2.0 cm solid mass is again noted in the central portion of the right kidney. Reference is made to recent MRI report. Again this mass could represent a solid renal malignancy. No hydronephrosis visualized. Left Kidney: Renal measurements: 12.1 x 6.3 x 5.1 cm = volume: 194.4 mL. Echogenicity within normal limits. No mass or hydronephrosis visualized. Bladder: Appears normal for degree of bladder distention. Other: None. IMPRESSION: 2.1 cm solid mass again noted the central portion of the right kidney. Reference is made to recent MRI report. Again this mass could represent a solid renal malignancy. Electronically Signed   By: Marcello Moores  Register   On: 08/01/2019 10:37    Labs:  CBC: No results for input(s): WBC, HGB, HCT, PLT in the last 8760 hours.  COAGS: No results for input(s): INR, APTT in the last 8760 hours.  BMP: No results for input(s): NA, K, CL, CO2, GLUCOSE, BUN, CALCIUM, CREATININE, GFRNONAA, GFRAA in the last 8760 hours.  Invalid input(s): CMP  LIVER FUNCTION TESTS: No results for input(s): BILITOT, AST, ALT, ALKPHOS, PROT, ALBUMIN in the last 8760 hours.  TUMOR MARKERS: No results for input(s): AFPTM, CEA, CA199, CHROMGRNA in the last 8760 hours.  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   Will proceed with image guided biopsy  today by Dr. Laurence Ferrari.  Risks and benefits of renal mass biopsy was discussed with the patient and/or patient's family including, but not limited to bleeding, infection, damage to adjacent structures or low yield requiring additional tests.  All of the questions were answered and there is agreement to proceed.  Consent signed and in chart.  Thank you for this interesting consult.  I greatly enjoyed meeting Marc Ellis and look forward to participating in their care.  A copy of this report was sent to the requesting provider on this date.  Electronically Signed: Murrell Redden, PA-C   08/14/2019, 7:27 AM      I spent a total of  30 Minutes in face to face in clinical consultation, greater than 50% of which was counseling/coordinating care for renal mass biopsy.

## 2019-08-14 NOTE — Discharge Instructions (Signed)
Percutaneous Kidney Biopsy, Care After This sheet gives you information about how to care for yourself after your procedure. Your health care provider may also give you more specific instructions. If you have problems or questions, contact your health care provider. What can I expect after the procedure? After the procedure, it is common to have:  Pain or soreness near the biopsy site.  Pink or cloudy urine for 24 hours after the procedure. Follow these instructions at home: Activity  Return to your normal activities as told by your health care provider. Ask your health care provider what activities are safe for you.  If you were given a sedative during the procedure, it can affect you for several hours. Do not drive or operate machinery until your health care provider says that it is safe.  Do not lift anything that is heavier than 10 lb (4.5 kg), or the limit that you are told, until your health care provider says that it is safe.  Avoid activities that take a lot of effort (are strenuous) until your health care provider approves. Most people will have to wait 2 weeks before returning to activities such as exercise or sex. General instructions   Take over-the-counter and prescription medicines only as told by your health care provider.  You may eat and drink after your procedure. Follow instructions from your health care provider about eating or drinking restrictions.  Check your biopsy site every day for signs of infection. Check for: ? More redness, swelling, or pain. ? Fluid or blood. ? Warmth. ? Pus or a bad smell.  Keep all follow-up visits as told by your health care provider. This is important. Contact a health care provider if:  You have more redness, swelling, or pain around your biopsy site.  You have fluid or blood coming from your biopsy site.  Your biopsy site feels warm to the touch.  You have pus or a bad smell coming from your biopsy site.  You have blood  in your urine more than 24 hours after your procedure.  You have a fever. Get help right away if:  Your urine is dark red or brown.  You cannot urinate.  It burns when you urinate.  You feel dizzy or light-headed.  You have severe pain in your abdomen or side. Summary  After the procedure, it is common to have pain or soreness at the biopsy site and pink or cloudy urine for the first 24 hours.  Check your biopsy site each day for signs of infection, such as more redness, swelling, or pain; fluid, blood, pus or a bad smell coming from the biopsy site; or the biopsy site feeling warm to touch.  Return to your normal activities as told by your health care provider. This information is not intended to replace advice given to you by your health care provider. Make sure you discuss any questions you have with your health care provider. Document Revised: 10/31/2018 Document Reviewed: 10/31/2018 Elsevier Patient Education  2020 [employer]  

## 2019-08-14 NOTE — Procedures (Signed)
Interventional Radiology Procedure Note  Procedure: US guided biopsy of endophytic 1.5 cm right renal lesion.   Complications: None  Estimated Blood Loss: None  Recommendations: - Path sent - Bedrest x 4hrs - DC home if no evidence of bleeding  Signed,  Criselda Peaches, MD

## 2019-08-15 LAB — SURGICAL PATHOLOGY

## 2019-08-28 ENCOUNTER — Other Ambulatory Visit: Payer: Self-pay | Admitting: Urology

## 2019-08-28 DIAGNOSIS — N2889 Other specified disorders of kidney and ureter: Secondary | ICD-10-CM

## 2019-09-16 ENCOUNTER — Other Ambulatory Visit: Payer: Self-pay | Admitting: *Deleted

## 2019-09-16 ENCOUNTER — Ambulatory Visit
Admission: RE | Admit: 2019-09-16 | Discharge: 2019-09-16 | Disposition: A | Discharge status: Home / Self Care | Payer: PRIVATE HEALTH INSURANCE | Source: Ambulatory Visit | Attending: Urology | Admitting: Urology

## 2019-09-16 ENCOUNTER — Encounter: Payer: Self-pay | Admitting: *Deleted

## 2019-09-16 ENCOUNTER — Other Ambulatory Visit: Payer: Self-pay

## 2019-09-16 DIAGNOSIS — N2889 Other specified disorders of kidney and ureter: Secondary | ICD-10-CM

## 2019-09-16 HISTORY — PX: IR RADIOLOGIST EVAL & MGMT: IMG5224

## 2019-09-16 NOTE — Consult Note (Signed)
Chief Complaint: Patient was consulted remotely today (TeleHealth) for right renal carcinoma ablation at the request of Ashley.    Referring Physician(s): Borden,Lester  History of Present Illness: Marc Ellis is a 54 y.o. male known to our service from prior biopsy who had detection of a roughly 1.3 cm right renal mass in the right kidney by CT on 05/29/19. MRI on 07/02/19 showed the interpolar, endophytic mass to measure more likely around 1.5 x 1.6 cm and demonstrate enhancement. The lesion was also visible by [place] and US guided biopsy on 08/14/19 by Dr. Laurence Ferrari demonstrated papillary renal cell carcinoma, solid type 1, nuclear grade 2.   Marc Ellis tolerated the biopsy procedure well with only minimal discomfort at the biopsy site for a day or two.  He did not have any hematuria after the biopsy procedure.  Imaging demonstrates no evidence of local metastatic disease or renal vein involvement.  The mass approaches the collecting system of the interpolar right kidney but is still relatively removed from the renal pelvis.  He has now been referred back by Dr. Alinda Money to discuss potential percutaneous cryoablation as a treatment option for the carcinoma given its size and location.  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  He has a history of prostate carcinoma and is currently not receiving any active treatment.  He does not have any trouble voiding.  Past Medical History:  Diagnosis Date  . Anxiety   . Elevated PSA   . Family history of prostate cancer   . Hyperlipidemia   . Hypertension    NO MEDS FOR HAS BEEN A LITTLE ELEVATED  . Prostate cancer [facility]) JAN 2017    Past Surgical History:  Procedure Laterality Date  . [place]  . LYMPHADENECTOMY Bilateral 08/02/2015   Procedure: PELVIC LYMPHADENECTOMY;  Surgeon: Raynelle Bring, MD;  Location: [facility];  Service:  Urology;  Laterality: Bilateral;  . PROSTATE BIOPSY    . ROBOT ASSISTED LAPAROSCOPIC RADICAL PROSTATECTOMY N/A 08/02/2015   Procedure: XI ROBOTIC ASSISTED LAPAROSCOPIC RADICAL PROSTATECTOMY LEVEL 2;  Surgeon: Raynelle Bring, MD;  Location: [facility];  Service: Urology;  Laterality: N/A;    Allergies: Patient has no known allergies.  Medications: Prior to Admission medications   Medication Sig Start Date End Date Taking? Authorizing Provider  acetaminophen (TYLENOL) 500 MG tablet Take 1,000 mg by mouth every 6 (six) hours as needed (For pain.).    [provider]  atorvastatin (LIPITOR) 10 MG tablet Take 10 mg by mouth at bedtime.     [provider]  azelastine (ASTELIN) 0.1 % nasal spray Place 2 sprays into both nostrils at bedtime as needed for allergies.  05/12/16   [provider]  bisoprolol-hydrochlorothiazide (ZIAC) 5-6.25 MG tablet Take 1 tablet by mouth daily. 06/13/19   [provider]  cetirizine (ZYRTEC) 10 MG tablet Take 10 mg by mouth daily.    [provider]  EPINEPHrine 0.3 mg/0.3 mL IJ SOAJ injection Inject 0.3 mg into the muscle once as needed (For anaphylaxis.).    [provider]  fluticasone (FLONASE) 50 MCG/ACT nasal spray Place 1 spray into both nostrils daily as needed for allergies.     [provider]  hydrOXYzine (VISTARIL) 25 MG capsule Take 25 mg by mouth at bedtime.  08/14/17   [provider]  LORazepam (ATIVAN) 1 MG tablet Take 0.5 mg by mouth  2 (two) times daily.     [provider]  Melatonin 10 MG CAPS Take 10 mg by mouth at bedtime as needed (sleep).     [provider]  montelukast (SINGULAIR) 10 MG tablet Take 10 mg by mouth daily.  07/09/17   [provider]  naproxen sodium (ALEVE) 220 MG tablet Take 220 mg by mouth daily as needed (pain).    [provider]  nicotine (NICOTROL) 10 MG inhaler Inhale 1 continuous puffing into the lungs as needed for smoking  cessation.    [provider]     Family History  Problem Relation Age of Onset  . Prostate cancer Father 41  . Prostate cancer Paternal Uncle        dx in his 62s  . Kidney failure Sister   . Diabetes Maternal Aunt   . Hypertension Maternal Grandmother   . Prostate cancer Paternal Grandfather        dx in his 64s    Social History   Socioeconomic History  . Marital status: Widowed    Spouse name: Not on file  . Number of children: 3  . Years of education: Not on file  . Highest education level: Not on file  Occupational History  . Not on file  Tobacco Use  . Smoking status: Current Every Day Smoker    Packs/day: 1.00    Years: 20.00    Pack years: 20.00    Types: Cigarettes  . Smokeless tobacco: Never Used  Substance and Sexual Activity  . Alcohol use: No  . Drug use: No  . Sexual activity: Not on file  Other Topics Concern  . Not on file  Social History Narrative  . Not on file   Social Determinants of Health   Financial Resource Strain:   . Difficulty of Paying Living Expenses:   Food Insecurity:   . Worried About Charity fundraiser in the Last Year:   . Arboriculturist in the Last Year:   Transportation Needs:   . Film/video editor (Medical):   Marland Kitchen Lack of Transportation (Non-Medical):   Physical Activity:   . Days of Exercise per Week:   . Minutes of Exercise per Session:   Stress:   . Feeling of Stress :   Social Connections:   . Frequency of Communication with Friends and Family:   . Frequency of Social Gatherings with Friends and Family:   . Attends Religious Services:   . Active Member of Clubs or Organizations:   . Attends Archivist Meetings:   Marland Kitchen Marital Status:     ECOG Status: 0 - Asymptomatic  Review of Systems  Constitutional: Negative.   Respiratory: Negative.   Cardiovascular: Negative.   Gastrointestinal: Negative.   Genitourinary: Positive for dysuria. Negative for decreased urine volume, difficulty  urinating, flank pain, frequency, hematuria and urgency.  Musculoskeletal: Negative.   Neurological: Negative.     Review of Systems: A 12 point ROS discussed and pertinent positives are indicated in the HPI above.  All other systems are negative.  Physical Exam No direct physical exam was performed (except for noted visual exam findings with Video Visits).   Vital Signs: There were no vitals taken for this visit.  Imaging: No results found.  Labs:  CBC: Recent Labs    08/14/19 0738  WBC 8.0  HGB 15.3  HCT 45.2  PLT 246    COAGS: Recent Labs    08/14/19 0738  INR  0.9    BMP: No results for input(s): NA, K, CL, CO2, GLUCOSE, Ellis, CALCIUM, CREATININE, GFRNONAA, GFRAA in the last 8760 hours.  Invalid input(s): CMP  LIVER FUNCTION TESTS: No results for input(s): BILITOT, AST, ALT, ALKPHOS, PROT, ALBUMIN in the last 8760 hours.  TUMOR MARKERS: No results for input(s): AFPTM, CEA, CA199, CHROMGRNA in the last 8760 hours.  Assessment and Plan:  I spoke with Marc Ellis via telemedicine software and was able to show him pictures from imaging of the papillary carcinoma of the right kidney and reviewed prior imaging.  We talked about treatment options including surgical resection and percutaneous ablation.  The lesion would be difficult to resect given its location and endophytic nature.  The lesion was visible by ultrasound to allow accurate biopsy and should be treatable with cryoablation as well.  Given its close proximity to the collecting system, placement of a ureteral stent may be of some benefit in decreasing the risk of scarring extending into the central collecting system.  Even if some regional scarring were to occur in the interpolar region, I told Marc Ellis that I did not think that treatment would have a significant impact on his renal function.  I discussed details of percutaneous cryoablation with Marc Ellis including risks and outcomes.  The procedure is  performed under general anesthesia at [facility] and typically involves overnight observation afterwards.  After discussion Marc Ellis would like to proceed with scheduling an ablation procedure.  We will begin the scheduling process.  We will coordinate right-sided ureteral stent placement with Dr. Alinda Money 1 to 2 weeks prior to renal cryoablation.  Thank you for this interesting consult.  I greatly enjoyed meeting Marc Ellis and look forward to participating in their care.  A copy of this report was sent to the requesting provider on this date.  Electronically Signed: Azzie Roup 09/16/2019, 8:50 AM   I spent a total of 15 Minutes in remote  clinical consultation, greater than 50% of which was counseling/coordinating care for treatment of a right renal carcinoma.    Visit type: Audio and video (Webex).   Alternative for in-person consultation at [facility], [address], [place], [place]. This visit type was conducted due to national recommendations for restrictions regarding the COVID-19 Pandemic (e.g. social distancing).  This format is felt to be most appropriate for this patient at this time.  All issues noted in this document were discussed and addressed.

## 2019-09-17 ENCOUNTER — Other Ambulatory Visit (HOSPITAL_COMMUNITY): Payer: Self-pay | Admitting: Interventional Radiology

## 2019-09-17 DIAGNOSIS — C649 Malignant neoplasm of unspecified kidney, except renal pelvis: Secondary | ICD-10-CM

## 2019-09-19 ENCOUNTER — Other Ambulatory Visit: Payer: Self-pay | Admitting: Urology

## 2019-09-30 NOTE — Patient Instructions (Signed)
DUE TO COVID-19 ONLY ONE VISITOR IS ALLOWED TO COME WITH YOU AND STAY IN THE WAITING ROOM ONLY DURING PRE OP AND PROCEDURE DAY OF SURGERY. TWO VISITOR MAY VISIT WITH YOU AFTER SURGERY IN YOUR PRIVATE ROOM DURING VISITING HOURS ONLY!  YOU NEED TO HAVE A COVID 19 TEST ON__7-22-21_____ @_______ , THIS TEST MUST BE DONE BEFORE SURGERY, COME  [address] , [id number].  ([place]) ONCE YOUR COVID TEST IS COMPLETED, PLEASE BEGIN THE QUARANTINE INSTRUCTIONS AS OUTLINED IN YOUR HANDOUT.                Marc Ellis  09/30/2019   Your procedure is scheduled on: 10-06-19   Report to [facility] Main  Entrance   Report to admitting at       1:15 PM     Call this number if you have problems the morning of surgery [phone]    Remember: Do not eat food  :After Midnight. You may have clear liquids until 0915 am then nothing by mouth    CLEAR LIQUID DIET   Foods Allowed                                                                                      Foods Excluded  Coffee and tea, regular and decaf NO CREAMER                            liquids that you cannot  Plain Jell-O any favor except red or purple                                           see through such as: Fruit ices (not with fruit pulp)                                                   milk, soups, orange juice  Iced Popsicles                                                       All solid food Carbonated beverages, regular and diet                                    Cranberry, grape and apple juices Sports drinks like Gatorade Lightly seasoned clear broth or consume(fat free) Sugar, honey syrup   _____________________________________________________________________    BRUSH YOUR TEETH MORNING OF SURGERY AND RINSE YOUR MOUTH OUT, NO CHEWING GUM CANDY OR MINTS.     Take these medicines the morning of surgery with A SIP OF WATER: Ativan if needed, zyrtec  You may not have any metal on your body including hair pins and              piercings  Do not wear jewelry,  lotions, powders or perfumes, deodorant        .              Men may shave face and neck.   Do not bring valuables to the hospital. [place].  Contacts, dentures or bridgework may not be worn into surgery.      Patients discharged the day of surgery will not be allowed to drive home. IF YOU ARE HAVING SURGERY AND GOING HOME THE SAME DAY, YOU MUST HAVE AN ADULT TO DRIVE YOU HOME AND BE WITH YOU FOR 24 HOURS. YOU MAY GO HOME BY TAXI OR UBER OR ORTHERWISE, BUT AN ADULT MUST ACCOMPANY YOU HOME AND STAY WITH YOU FOR 24 HOURS.  Name and phone number of your driver:  Special Instructions: N/A              Please read over the following fact sheets you were given: _____________________________________________________________________             [facility] - Preparing for Surgery Before surgery, you can play an important role.  Because skin is not sterile, your skin needs to be as free of germs as possible.  You can reduce the number of germs on your skin by washing with CHG (chlorahexidine gluconate) soap before surgery.  CHG is an antiseptic cleaner which kills germs and bonds with the skin to continue killing germs even after washing. Please DO NOT use if you have an allergy to CHG or antibacterial soaps.  If your skin becomes reddened/irritated stop using the CHG and inform your nurse when you arrive at Short Stay. Do not shave (including legs and underarms) for at least 48 hours prior to the first CHG shower.  You may shave your face/neck. Please follow these instructions carefully:  1.  Shower with CHG Soap the night before surgery and the  morning of Surgery.  2.  If you choose to wash your hair, wash your hair first as usual with your  normal  shampoo.  3.  After you shampoo, rinse your hair and body thoroughly to remove  the  shampoo.                           4.  Use CHG as you would any other liquid soap.  You can apply chg directly  to the skin and wash                       Gently with a scrungie or clean washcloth.  5.  Apply the CHG Soap to your body ONLY FROM THE NECK DOWN.   Do not use on face/ open                           Wound or open sores. Avoid contact with eyes, ears mouth and genitals (private parts).                       Wash face,  Genitals (private parts) with your normal soap.  6.  Wash thoroughly, paying special attention to the area where your surgery  will be performed.  7.  Thoroughly rinse your body with warm water from the neck down.  8.  DO NOT shower/wash with your normal soap after using and rinsing off  the CHG Soap.                9.  Pat yourself dry with a clean towel.            10.  Wear clean pajamas.            11.  Place clean sheets on your bed the night of your first shower and do not  sleep with pets. Day of Surgery : Do not apply any lotions/deodorants the morning of surgery.  Please wear clean clothes to the hospital/surgery center.  FAILURE TO FOLLOW THESE INSTRUCTIONS MAY RESULT IN THE CANCELLATION OF YOUR SURGERY PATIENT SIGNATURE_________________________________  NURSE SIGNATURE__________________________________  ________________________________________________________________________

## 2019-10-01 ENCOUNTER — Other Ambulatory Visit: Payer: Self-pay

## 2019-10-01 ENCOUNTER — Encounter (HOSPITAL_COMMUNITY): Payer: Self-pay

## 2019-10-01 ENCOUNTER — Encounter (HOSPITAL_COMMUNITY)
Admission: RE | Admit: 2019-10-01 | Discharge: 2019-10-01 | Disposition: A | Discharge status: Home / Self Care | Payer: PRIVATE HEALTH INSURANCE | Source: Ambulatory Visit | Attending: Urology | Admitting: Urology

## 2019-10-01 DIAGNOSIS — Z01818 Encounter for other preprocedural examination: Secondary | ICD-10-CM | POA: Insufficient documentation

## 2019-10-01 HISTORY — DX: Other specified disorders of kidney and ureter: N28.89

## 2019-10-01 LAB — BASIC METABOLIC PANEL
Anion gap: 8 (ref 5–15)
BUN: 17 mg/dL (ref 6–20)
CO2: 26 mmol/L (ref 22–32)
Calcium: 9.6 mg/dL (ref 8.9–10.3)
Chloride: 105 mmol/L (ref 98–111)
Creatinine, Ser: 0.91 mg/dL (ref 0.61–1.24)
GFR calc Af Amer: >60 mL/min (ref >60)
GFR calc non Af Amer: >60 mL/min (ref >60)
Glucose, Bld: 113 mg/dL — ABNORMAL HIGH (ref 70–99)
Potassium: 4.4 mmol/L (ref 3.5–5.1)
Sodium: 139 mmol/L (ref 135–145)

## 2019-10-01 LAB — CBC
HCT: 46.3 % (ref 39.0–52.0)
Hemoglobin: 15.3 g/dL (ref 13.0–17.0)
MCH: 31 pg (ref 26.0–34.0)
MCHC: 33 g/dL (ref 30.0–36.0)
MCV: 93.9 fL (ref 80.0–100.0)
Platelets: 259 10*3/uL (ref 150–400)
RBC: 4.93 MIL/uL (ref 4.22–5.81)
RDW: 13.6 % (ref 11.5–15.5)
WBC: 9 10*3/uL (ref 4.0–10.5)
nRBC: 0 % (ref 0.0–0.2)

## 2019-10-01 NOTE — Progress Notes (Signed)
PCP - Candiss Norse PA-C lov 09-24-19 Cardiologist - no  PPM/ICD -  Device Orders -  Rep Notified -   Chest x-ray -  EKG - Done at preop Stress Test -  ECHO -  Cardiac Cath -   Sleep Study -  CPAP -   Fasting Blood Sugar -  Checks Blood Sugar _____ times a day  Blood Thinner Instructions: Aspirin Instructions:  ERAS Protcol - PRE-SURGERY Ensure or G2-   COVID TEST- 7-22 ACTIVITY-Works outside . [place] [employer] without sob  Anesthesia review:   Patient denies shortness of breath, fever, cough and chest pain at PAT appointment  none   All instructions explained to the patient, with a verbal understanding of the material. Patient agrees to go over the instructions while at home for a better understanding. Patient also instructed to self quarantine after being tested for COVID-19. The opportunity to ask questions was provided.

## 2019-10-02 ENCOUNTER — Other Ambulatory Visit (HOSPITAL_COMMUNITY)
Admission: RE | Admit: 2019-10-02 | Discharge: 2019-10-02 | Disposition: A | Discharge status: Home / Self Care | Payer: PRIVATE HEALTH INSURANCE | Source: Ambulatory Visit | Attending: Urology | Admitting: Urology

## 2019-10-02 DIAGNOSIS — Z01812 Encounter for preprocedural laboratory examination: Secondary | ICD-10-CM | POA: Diagnosis not present

## 2019-10-02 DIAGNOSIS — Z20822 Contact with and (suspected) exposure to covid-19: Secondary | ICD-10-CM | POA: Insufficient documentation

## 2019-10-02 LAB — SARS CORONAVIRUS 2 (TAT 6-24 HRS): SARS Coronavirus 2: NEGATIVE

## 2019-10-03 NOTE — H&P (Signed)
1. Prostate cancer  2. Microscopic hematuria  3. Right renal neoplasm/renal cell carcinoma   Marc Ellis returns today for further evaluation. He was incidentally noted to have a small 1.5 cm centrally located enhancing right renal mass on his hematuria protocol CT scan. It was felt that his hematuria was most likely related to radiation changes. Ultimately, he proceeded with an MRI which confirmed enhancing central mass located near the renal hilum and renal collecting system. This remains suspicious for a renal malignancy. He then ultimately underwent an image guided renal mass biopsy which confirmed type 1 papillary renal cell carcinoma, nuclear grade 2. He returns today for further evaluation. He tolerated his biopsy well and without complications.     ALLERGIES: No Allergies    MEDICATIONS: Diazepam 10 mg tablet 1 tablet PO 30-60 minutes prior to procedure  Tadalafil 5 mg tablet 1 tablet PO Daily  Zyrtec 10 mg tablet  Azelastine Hcl 137 mcg (0.1 %) aerosol, spray with pump  Bisoprolol-Hydrochlorothiazide 5 mg-6.25 mg tablet  EpiPen 0.3 MG/0.3ML DEVI Injection  Flonase Allergy Relief 50 mcg/actuation spray, suspension  Lipitor 10 mg tablet Oral  Lorazepam 1 mg tablet  Melatonin  Montelukast Sodium 10 mg tablet tablet  Vistaril     GU PSH: Cystoscopy - 05/16/2019 Laparoscopy; Lymphadenectomy - 2017 Locm 300-399Mg /Ml Iodine,1Ml - 05/29/2019 Robotic Radical Prostatectomy - 2017       PSH Notes: Laparoscopy With Bilateral Total Pelvic Lymphadenectomy, Prostatect Retropubic Radical W/ Nerve Sparing Laparoscopic, Inguinal Hernia Repair   NON-GU PSH: None   GU PMH: Microscopic hematuria - 12/10/2018 Prostate Cancer - 09/26/2017 History of prostate cancer - 2017 ED due to arterial insufficiency, Erectile dysfunction due to arterial insufficiency - 2017      PMH Notes:   1) Prostate cancer: He is s/p a BNS RAL radical prostatectomy and BPLND on 08/02/15. His PSA began increasing to 0.07.  He had a very low threshold for wanting to consider salvage radiation and elected to proceed in August 2019 through October 2019 with salvage radiation therapy. His PSA became undetectable following radiation.   Diagnosis: pT2c N0 Mx, Gleason 4+3=7 adenocarcinoma with a focal positive margin (0/22 LN)  Pretreatment PSA: 13.4  Pretreatment SHIM: 30 Jul 2015: Radical prostatectomy  Aug-Oct 2019: Salvage radiation therapy (68.4 Gy) - PSA decreased to undetectable   2) Hematuria: He had persistent microscopic hematuria in March 2021 prompting a urologic evaluation.   Mar 2021: Cysto - radiation changes in bulbar urethra, CT imaging - No obvious cause for hematuria, incidental 1.3 cm right renal mass   3) Right renal neoplasm: He was incidentally found to have a 1.3 cm central, endophytic enhancing renal mass on his CT scan during his evaluation for hematuria. This was visualized on ultrasound and he underwent an ultrasound guided biopsy of this lesion on 08/14/19.   Jun 2021: U/S guided biopsy -     NON-GU PMH: Anxiety, Anxiety - 2017 Hypercholesterolemia Hypertension    FAMILY HISTORY: Diabetes - Runs In Family Hypertension - Runs In Family Prostate Cancer - Runs In Family   SOCIAL HISTORY: Marital Status: Single Preferred Language: English; Ethnicity: Not Hispanic Or Latino; Race: White Current Smoking Status: Patient smokes.  Has never drank.  Drinks 4+ caffeinated drinks per day.     Notes: Alcohol use, Single, Current every day smoker   REVIEW OF SYSTEMS:    GU Review Male:   Patient reports burning/ pain with urination. Patient denies frequent urination, hard to postpone urination, get up  at night to urinate, leakage of urine, stream starts and stops, trouble starting your streams, and have to strain to urinate .  Gastrointestinal (Lower):   Patient denies diarrhea and constipation.  Gastrointestinal (Upper):   Patient denies nausea and vomiting.  Constitutional:   Patient  denies fever, night sweats, weight loss, and fatigue.  Skin:   Patient denies skin rash/ lesion and itching.  Eyes:   Patient denies blurred vision and double vision.  Ears/ Nose/ Throat:   Patient denies sore throat and sinus problems.  Hematologic/Lymphatic:   Patient denies swollen glands and easy bruising.  Cardiovascular:   Patient denies leg swelling and chest pains.  Respiratory:   Patient denies cough and shortness of breath.  Endocrine:   Patient denies excessive thirst.  Musculoskeletal:   Patient denies back pain and joint pain.  Neurological:   Patient denies headaches and dizziness.  Psychologic:   Patient denies depression and anxiety.   VITAL SIGNS:      08/26/2019 08:50 AM  Weight 230 lb / 104.33 kg  Height 70 in / 177.8 cm  BP 124/74 mmHg  Pulse 64 /min  Temperature 97.1 F / 36.1 C  BMI 33.0 kg/m   MULTI-SYSTEM PHYSICAL EXAMINATION:    Constitutional: Well-nourished. No physical deformities. Normally developed. Good grooming.  Respiratory: No labored breathing, no use of accessory muscles.   Cardiovascular: Normal temperature, normal extremity pulses, no swelling, no varicosities.  Gastrointestinal: No mass, no tenderness, no rigidity, non obese abdomen.     Complexity of Data:  Records Review:   Pathology Reports  Urine Test Review:   Urinalysis  X-Ray Review: C.T. Abdomen/Pelvis: Reviewed Films.  MRI Abdomen: Reviewed Films.     12/04/18 05/01/18 09/19/17 03/14/17 08/02/16 01/26/16 09/24/15  PSA  Total PSA <0.015 ng/mL 0.018 ng/mL 0.073 ng/mL 0.042 ng/mL 0.018 ng/dl < 0.015  < 0.01     PROCEDURES:          Urinalysis w/Scope Dipstick Dipstick Cont'd Micro  Color: Yellow Bilirubin: Neg mg/dL WBC/hpf: 0 - 5/hpf  Appearance: Clear Ketones: Neg mg/dL RBC/hpf: 3 - 10/hpf  Specific Gravity: 1.025 Blood: 2+ ery/uL Bacteria: Few (10-25/hpf)  pH: 5.5 Protein: Neg mg/dL Cystals: NS (Not Seen)  Glucose: Neg mg/dL Urobilinogen: 0.2 mg/dL Casts: NS (Not Seen)     Nitrites: Neg Trichomonas: Not Present    Leukocyte Esterase: Neg leu/uL Mucous: Present      Epithelial Cells: NS (Not Seen)      Yeast: NS (Not Seen)      Sperm: Not Present    ASSESSMENT:      ICD-10 Details  1 GU:   Right renal neoplasm - D49.511   2   Renal cell carcinoma, right - C64.1    PLAN:            Medications Stop Meds: Diazepam 10 mg tablet 1 tablet PO 30-60 minutes prior to procedure  Start: 06/05/2019  Discontinue: 08/26/2019  - Reason: The medication cycle was completed.            Orders         Schedule X-Rays: 1 Week - Interventional Radiology Without Contrast - Please schedule consultation with Dr. Kathlene Cote to discuss percutaneous ablation of right renal tumor.  Return Visit/Planned Activity: Other See Visit Notes             Note: Pt to be referred to Dr. Kathlene Cote in IR to discuss ablation of renal mass.  Document Letter(s):  Created for Patient: Clinical Summary         Notes:   1. Renal cell carcinoma: We reviewed his biopsy results that confirm renal cell carcinoma. This mass is small and relatively low-grade but is still a concern considering his age and life expectancy. I did not recommend continued observation although he understands that treatment is not urgent necessarily. We did review options for treatment and his case was recently presented at the GU tumor Board Conference. I did discuss situation with Dr. Kathlene Cote. He is a candidate for percutaneous ablation with the cavity odd that this tumor is centrally located and near the collecting system which may carry a risk for local recurrence considering the heat sink affect of the renal collecting system. We also discussed the need for preoperative ureteral stenting to help protect the renal collecting system. It was explained that the benefits of this approach would result in preservation of the majority of the right kidney with the increased risk of local recurrence. He understands that  repeat ablation and/or nephrectomy would be options if he does have a recurrence.   Alternatively, we did discuss definitive therapy with a right laparoscopic radical nephrectomy. Unfortunately, due to the location of this small tumor, it would not be amenable to nephron sparing surgery. Furthermore, he does have normal baseline renal function with a serum creatinine of 0.6 and a normal appearing left-sided kidney. He understands that nephrectomy would likely have a fairly small impact on his overall renal function but does increase the risk of worsening renal function over time which may place him at other risks over the rest of his life.   After a fairly detailed discussion about the pros and cons of each approach, we have agreed to have him see Dr. Kathlene Cote to further discuss percutaneous thermal ablation. If he elects to proceed in this fashion, I will coordinate with Dr. Kathlene Cote to proceed with preoperative ureteral stenting. Will then likely keep his stent in for approximately 1 month until his post treatment imaging has been performed before removing his stent in the office.   2. Prostate cancer: Technically would be due for his next PSA in September. This is currently scheduled but we may consider doing this earlier at his stent removal visit.   3. Microscopic hematuria: This most likely related to radiation changes. Continue observation.

## 2019-10-05 NOTE — Anesthesia Preprocedure Evaluation (Addendum)
Anesthesia Evaluation  Patient identified by MRN, date of birth, ID band Patient awake    Reviewed: Allergy & Precautions, NPO status , Patient's Chart, lab work & pertinent test results, reviewed documented beta blocker date and time   History of Anesthesia Complications Negative for: history of anesthetic complications  Airway Mallampati: II  TM Distance: >3 FB Neck ROM: Full    Dental no notable dental hx.    Pulmonary Current Smoker and Patient abstained from smoking.,    Pulmonary exam normal        Cardiovascular hypertension, Pt. on medications and Pt. on home beta blockers Normal cardiovascular exam     Neuro/Psych Anxiety negative neurological ROS     GI/Hepatic negative GI ROS, Neg liver ROS,   Endo/Other  negative endocrine ROS  Renal/GU Right renal mass  negative genitourinary   Musculoskeletal negative musculoskeletal ROS (+)   Abdominal   Peds  Hematology negative hematology ROS (+)   Anesthesia Other Findings Day of surgery medications reviewed with patient.  Reproductive/Obstetrics negative OB ROS                            Anesthesia Physical Anesthesia Plan  ASA: II  Anesthesia Plan: General   Post-op Pain Management:    Induction: Intravenous  PONV Risk Score and Plan: 2 and Treatment may vary due to age or medical condition, Ondansetron, Dexamethasone and Midazolam  Airway Management Planned: LMA  Additional Equipment: None  Intra-op Plan:   Post-operative Plan: Extubation in OR  Informed Consent: I have reviewed the patients History and Physical, chart, labs and discussed the procedure including the risks, benefits and alternatives for the proposed anesthesia with the patient or authorized representative who has indicated his/her understanding and acceptance.     Dental advisory given  Plan Discussed with: CRNA  Anesthesia Plan Comments:         Anesthesia Quick Evaluation

## 2019-10-06 ENCOUNTER — Ambulatory Visit (HOSPITAL_COMMUNITY)
Admission: RE | Admit: 2019-10-06 | Discharge: 2019-10-06 | Disposition: A | Discharge status: Home / Self Care | Payer: PRIVATE HEALTH INSURANCE | Attending: Urology | Admitting: Urology

## 2019-10-06 ENCOUNTER — Ambulatory Visit (HOSPITAL_COMMUNITY): Payer: PRIVATE HEALTH INSURANCE

## 2019-10-06 ENCOUNTER — Ambulatory Visit (HOSPITAL_COMMUNITY): Payer: PRIVATE HEALTH INSURANCE | Admitting: Anesthesiology

## 2019-10-06 ENCOUNTER — Other Ambulatory Visit: Payer: Self-pay | Admitting: Radiology

## 2019-10-06 ENCOUNTER — Encounter (HOSPITAL_COMMUNITY)
Admission: RE | Disposition: A | Discharge status: Home / Self Care | Payer: Self-pay | Source: Home / Self Care | Attending: Urology

## 2019-10-06 ENCOUNTER — Encounter (HOSPITAL_COMMUNITY): Payer: Self-pay | Admitting: Urology

## 2019-10-06 DIAGNOSIS — Z9079 Acquired absence of other genital organ(s): Secondary | ICD-10-CM | POA: Diagnosis not present

## 2019-10-06 DIAGNOSIS — I10 Essential (primary) hypertension: Secondary | ICD-10-CM | POA: Insufficient documentation

## 2019-10-06 DIAGNOSIS — E7800 Pure hypercholesterolemia, unspecified: Secondary | ICD-10-CM | POA: Diagnosis not present

## 2019-10-06 DIAGNOSIS — F17200 Nicotine dependence, unspecified, uncomplicated: Secondary | ICD-10-CM | POA: Diagnosis not present

## 2019-10-06 DIAGNOSIS — Z79899 Other long term (current) drug therapy: Secondary | ICD-10-CM | POA: Insufficient documentation

## 2019-10-06 DIAGNOSIS — F419 Anxiety disorder, unspecified: Secondary | ICD-10-CM | POA: Diagnosis not present

## 2019-10-06 DIAGNOSIS — C641 Malignant neoplasm of right kidney, except renal pelvis: Secondary | ICD-10-CM | POA: Diagnosis not present

## 2019-10-06 DIAGNOSIS — Z8546 Personal history of malignant neoplasm of prostate: Secondary | ICD-10-CM | POA: Insufficient documentation

## 2019-10-06 DIAGNOSIS — R319 Hematuria, unspecified: Secondary | ICD-10-CM | POA: Diagnosis not present

## 2019-10-06 HISTORY — PX: CYSTOSCOPY WITH STENT PLACEMENT: SHX5790

## 2019-10-06 SURGERY — CYSTOSCOPY, WITH STENT INSERTION
Anesthesia: General | Laterality: Right

## 2019-10-06 MED ORDER — LACTATED RINGERS IV SOLN: INTRAVENOUS | Status: DC

## 2019-10-06 MED ORDER — LIDOCAINE 2% (20 MG/ML) 5 ML SYRINGE
INTRAMUSCULAR | Status: DC | PRN
  Administered 2019-10-06: 100 mg via INTRAVENOUS

## 2019-10-06 MED ORDER — PROPOFOL 10 MG/ML IV BOLUS
INTRAVENOUS | Status: AC
  Filled 2019-10-06: qty 20

## 2019-10-06 MED ORDER — 0.9 % SODIUM CHLORIDE (POUR BTL) OPTIME
TOPICAL | Status: DC | PRN
  Administered 2019-10-06: 1000 mL

## 2019-10-06 MED ORDER — ONDANSETRON HCL 4 MG/2ML IJ SOLN
INTRAMUSCULAR | Status: AC
  Filled 2019-10-06: qty 2

## 2019-10-06 MED ORDER — IOHEXOL 300 MG/ML  SOLN
INTRAMUSCULAR | Status: DC | PRN
  Administered 2019-10-06: 3 mL

## 2019-10-06 MED ORDER — PROMETHAZINE HCL 25 MG/ML IJ SOLN: 6.2500 mg | INTRAMUSCULAR | Status: DC | PRN

## 2019-10-06 MED ORDER — OXYCODONE HCL 5 MG PO TABS: 5.0000 mg | ORAL_TABLET | Freq: Once | ORAL | Status: DC | PRN

## 2019-10-06 MED ORDER — OXYCODONE HCL 5 MG/5ML PO SOLN: 5.0000 mg | Freq: Once | ORAL | Status: DC | PRN

## 2019-10-06 MED ORDER — CEFAZOLIN SODIUM-DEXTROSE 2-4 GM/100ML-% IV SOLN
2.0000 g | Freq: Once | INTRAVENOUS | Status: AC
  Administered 2019-10-06: 2 g via INTRAVENOUS
  Filled 2019-10-06: qty 100

## 2019-10-06 MED ORDER — LIDOCAINE 2% (20 MG/ML) 5 ML SYRINGE
INTRAMUSCULAR | Status: AC
  Filled 2019-10-06: qty 5

## 2019-10-06 MED ORDER — DEXAMETHASONE SODIUM PHOSPHATE 10 MG/ML IJ SOLN
INTRAMUSCULAR | Status: DC | PRN
  Administered 2019-10-06: 10 mg via INTRAVENOUS

## 2019-10-06 MED ORDER — SUCCINYLCHOLINE CHLORIDE 200 MG/10ML IV SOSY
PREFILLED_SYRINGE | INTRAVENOUS | Status: AC
  Filled 2019-10-06: qty 10

## 2019-10-06 MED ORDER — EPHEDRINE 5 MG/ML INJ
INTRAVENOUS | Status: AC
  Filled 2019-10-06: qty 10

## 2019-10-06 MED ORDER — MIDAZOLAM HCL 2 MG/2ML IJ SOLN
INTRAMUSCULAR | Status: AC
  Filled 2019-10-06: qty 2

## 2019-10-06 MED ORDER — FENTANYL CITRATE (PF) 100 MCG/2ML IJ SOLN
INTRAMUSCULAR | Status: DC | PRN
  Administered 2019-10-06: 50 ug via INTRAVENOUS
  Administered 2019-10-06: 25 ug via INTRAVENOUS
  Administered 2019-10-06: 50 ug via INTRAVENOUS

## 2019-10-06 MED ORDER — CHLORHEXIDINE GLUCONATE 0.12 % MT SOLN
15.0000 mL | Freq: Once | OROMUCOSAL | Status: AC
  Administered 2019-10-06: 15 mL via OROMUCOSAL

## 2019-10-06 MED ORDER — FENTANYL CITRATE (PF) 100 MCG/2ML IJ SOLN: 25.0000 ug | INTRAMUSCULAR | Status: DC | PRN

## 2019-10-06 MED ORDER — DEXAMETHASONE SODIUM PHOSPHATE 10 MG/ML IJ SOLN
INTRAMUSCULAR | Status: AC
  Filled 2019-10-06: qty 1

## 2019-10-06 MED ORDER — ACETAMINOPHEN 500 MG PO TABS
1000.0000 mg | ORAL_TABLET | Freq: Once | ORAL | Status: AC
  Administered 2019-10-06: 1000 mg via ORAL
  Filled 2019-10-06: qty 2

## 2019-10-06 MED ORDER — ORAL CARE MOUTH RINSE: 15.0000 mL | Freq: Once | OROMUCOSAL | Status: AC

## 2019-10-06 MED ORDER — MIDAZOLAM HCL 5 MG/5ML IJ SOLN
INTRAMUSCULAR | Status: DC | PRN
  Administered 2019-10-06: 2 mg via INTRAVENOUS

## 2019-10-06 MED ORDER — STERILE WATER FOR IRRIGATION IR SOLN
Status: DC | PRN
  Administered 2019-10-06: 3000 mL

## 2019-10-06 MED ORDER — PROPOFOL 10 MG/ML IV BOLUS
INTRAVENOUS | Status: DC | PRN
  Administered 2019-10-06: 200 mg via INTRAVENOUS

## 2019-10-06 MED ORDER — FENTANYL CITRATE (PF) 250 MCG/5ML IJ SOLN
INTRAMUSCULAR | Status: AC
  Filled 2019-10-06: qty 5

## 2019-10-06 MED ORDER — ONDANSETRON HCL 4 MG/2ML IJ SOLN
INTRAMUSCULAR | Status: DC | PRN
  Administered 2019-10-06: 4 mg via INTRAVENOUS

## 2019-10-06 MED ORDER — PHENYLEPHRINE 40 MCG/ML (10ML) SYRINGE FOR IV PUSH (FOR BLOOD PRESSURE SUPPORT)
PREFILLED_SYRINGE | INTRAVENOUS | Status: AC
  Filled 2019-10-06: qty 10

## 2019-10-06 SURGICAL SUPPLY — 13 items
BAG URO CATCHER STRL LF (MISCELLANEOUS) ×3 IMPLANT
CATH INTERMIT  6FR 70CM (CATHETERS) ×3 IMPLANT
CLOTH BEACON ORANGE TIMEOUT ST (SAFETY) ×3 IMPLANT
GLOVE BIOGEL M STRL SZ7.5 (GLOVE) ×3 IMPLANT
GOWN STRL REUS W/TWL LRG LVL3 (GOWN DISPOSABLE) ×6 IMPLANT
GUIDEWIRE STR DUAL SENSOR (WIRE) ×3 IMPLANT
KIT TURNOVER KIT A (KITS) IMPLANT
MANIFOLD NEPTUNE II (INSTRUMENTS) ×3 IMPLANT
PACK CYSTO (CUSTOM PROCEDURE TRAY) ×3 IMPLANT
STENT URET 6FRX26 CONTOUR (STENTS) ×2 IMPLANT
TUBING CONNECTING 10 (TUBING) ×2 IMPLANT
TUBING CONNECTING 10' (TUBING) ×1
TUBING UROLOGY SET (TUBING) IMPLANT

## 2019-10-06 NOTE — Anesthesia Procedure Notes (Signed)
Procedure Name: Intubation Date/Time: 10/06/2019 3:14 PM Performed by: Silas [place], CRNA Pre-anesthesia Checklist: Patient identified, Emergency Drugs available, Suction available and Patient being monitored Patient Re-evaluated:Patient Re-evaluated prior to induction Oxygen Delivery Method: Circle system utilized Preoxygenation: Pre-oxygenation with 100% oxygen Induction Type: IV induction Ventilation: Mask ventilation without difficulty LMA: LMA inserted LMA Size: 5.0 Tube type: Oral Number of attempts: 1 Placement Confirmation: positive ETCO2 and breath sounds checked- equal and bilateral Tube secured with: Tape Dental Injury: Teeth and Oropharynx as per pre-operative assessment

## 2019-10-06 NOTE — Transfer of Care (Signed)
Immediate Anesthesia Transfer of Care Note  Patient: Marc Ellis  Procedure(s) Performed: CYSTOSCOPY WITH STENT PLACEMENT (Right )  Patient Location: PACU  Anesthesia Type:General  Level of Consciousness: awake, oriented, patient cooperative and responds to stimulation  Airway & Oxygen Therapy: Patient Spontanous Breathing and Patient connected to face mask oxygen  Post-op Assessment: Report given to RN and Post -op Vital signs reviewed and stable  Post vital signs: Reviewed and stable  Last Vitals:  Vitals Value Taken Time  BP 117/79 10/06/19 1544  Temp 36.6 C 10/06/19 1544  Pulse 58 10/06/19 1545  Resp 11 10/06/19 1545  SpO2 96 % 10/06/19 1545  Vitals shown include unvalidated device data.  Last Pain:  Vitals:   10/06/19 1339  TempSrc:   PainSc: 0-No pain         Complications: No complications documented.

## 2019-10-06 NOTE — Op Note (Signed)
Preoperative diagnosis:  1. Right renal cell carcinoma  Postoperative diagnosis:  1. Right renal cell carcinoma  Procedure:  1. Cystoscopy 2. Right ureteral stent placement (6 x 26 - no string) 3. Right retrograde pyelography with interpretation  Surgeon: Pryor Curia. M.D.  Anesthesia: General  Complications: None  Intraoperative findings: Right retrograde pyelography was performed with a 6 French ureteral catheter and Omnipaque contrast.  This demonstrated normal renal collecting system and normal proximal ureter without filling defects.  This helped to confirm appropriate position for the indwelling right ureteral stent.  EBL: Minimal  Specimens: None  Indication: Marc Ellis is a 54 y.o. patient with a right renal neoplasm that is biopsy-proven renal cell carcinoma.  After discussing options for treatment, due to the central location of his small tumor, he elected to proceed with percutaneous ablation.  Considering the proximity to the renal collecting system, it was recommended by interventional radiology that he have a right ureteral stent placed prior to his procedure later this week.  After reviewing the management options for treatment, he elected to proceed with the above surgical procedure(s). We have discussed the potential benefits and risks of the procedure, side effects of the proposed treatment, the likelihood of the patient achieving the goals of the procedure, and any potential problems that might occur during the procedure or recuperation. Informed consent has been obtained.  Description of procedure:  The patient was taken to the operating room and general anesthesia was induced.  The patient was placed in the dorsal lithotomy position, prepped and draped in the usual sterile fashion, and preoperative antibiotics were administered. A preoperative time-out was performed.   Cystourethroscopy was performed.  The patient's urethra was examined and was normal.   The prostatic urethra was surgically absent.  The bladder was then systematically examined in its entirety. There was no evidence for any bladder tumors, stones, or other mucosal pathology.    Attention then turned to the right ureteral orifice and a ureteral catheter was used to intubate the ureteral orifice.  Omnipaque contrast was injected through the ureteral catheter and a retrograde pyelogram was performed with findings as dictated above.  A 0.38 sensor guidewire was then advanced up the right ureter into the renal pelvis under fluoroscopic guidance.  The wire was then backloaded through the cystoscope and a ureteral stent was advance over the wire using Seldinger technique.  The stent was positioned appropriately under fluoroscopic and cystoscopic guidance.  The wire was then removed with an adequate stent curl noted in the renal pelvis as well as in the bladder.  The bladder was then emptied and the procedure ended.  The patient appeared to tolerate the procedure well and without complications.  The patient was able to be awakened and transferred to the recovery unit in satisfactory condition.    Pryor Curia MD

## 2019-10-06 NOTE — Discharge Instructions (Addendum)

## 2019-10-06 NOTE — Anesthesia Postprocedure Evaluation (Signed)
Anesthesia Post Note  Patient: Marc Ellis  Procedure(s) Performed: CYSTOSCOPY WITH STENT PLACEMENT (Right )     Patient location during evaluation: PACU Anesthesia Type: General Level of consciousness: awake and alert and oriented Pain management: pain level controlled Vital Signs Assessment: post-procedure vital signs reviewed and stable Respiratory status: spontaneous breathing, nonlabored ventilation and respiratory function stable Cardiovascular status: blood pressure returned to baseline Postop Assessment: no apparent nausea or vomiting Anesthetic complications: no   No complications documented.  Last Vitals:  Vitals:   10/06/19 1615 10/06/19 1630  BP:  (!) 148/100  Pulse: 62 63  Resp:  16  Temp:  36.4 C  SpO2: 93% 95%    Last Pain:  Vitals:   10/06/19 1630  TempSrc:   PainSc: 0-No pain                 Brennan Bailey

## 2019-10-07 ENCOUNTER — Encounter (HOSPITAL_COMMUNITY): Payer: Self-pay | Admitting: Interventional Radiology

## 2019-10-07 ENCOUNTER — Other Ambulatory Visit: Payer: Self-pay | Admitting: Radiology

## 2019-10-07 ENCOUNTER — Other Ambulatory Visit: Payer: Self-pay

## 2019-10-07 NOTE — Anesthesia Preprocedure Evaluation (Addendum)
Anesthesia Evaluation  Patient identified by MRN, date of birth, ID band Patient awake    Reviewed: Allergy & Precautions, NPO status , Patient's Chart, lab work & pertinent test results, reviewed documented beta blocker date and time   Airway Mallampati: IV  TM Distance: >3 FB Neck ROM: Full    Dental no notable dental hx.    Pulmonary Current SmokerPatient did not abstain from smoking.,    Pulmonary exam normal breath sounds clear to auscultation       Cardiovascular hypertension, Pt. on home beta blockers and Pt. on medications Normal cardiovascular exam Rhythm:Regular Rate:Normal  ECG: NSR, rate 60   Neuro/Psych Anxiety negative neurological ROS     GI/Hepatic negative GI ROS, Neg liver ROS,   Endo/Other  negative endocrine ROS  Renal/GU Renal mass     Musculoskeletal negative musculoskeletal ROS (+)   Abdominal (+) + obese,   Peds  Hematology HLD   Anesthesia Other Findings Right renal cell carcinoma  Reproductive/Obstetrics                            Anesthesia Physical Anesthesia Plan  ASA: II  Anesthesia Plan: General   Post-op Pain Management:    Induction: Intravenous  PONV Risk Score and Plan: 1 and Ondansetron, Dexamethasone, Midazolam and Treatment may vary due to age or medical condition  Airway Management Planned: Oral ETT  Additional Equipment:   Intra-op Plan:   Post-operative Plan: Extubation in OR  Informed Consent: I have reviewed the patients History and Physical, chart, labs and discussed the procedure including the risks, benefits and alternatives for the proposed anesthesia with the patient or authorized representative who has indicated his/her understanding and acceptance.     Dental advisory given  Plan Discussed with: CRNA  Anesthesia Plan Comments:        Anesthesia Quick Evaluation

## 2019-10-07 NOTE — Progress Notes (Signed)
covid test on 10/02/2019.  Cystoscopy on 10/06/19.  Patient discharged home.  Patient reports he has been quarantined since discharged to home on 10/06/19.  Spoke with Abelardo Diesel in Short Stay .  As long as patient remains quarantines he will not have to be retested prior to procedure on 10/08/19.  Patient made aware and agreed to remain quarantined until surgery on 10/08/19.

## 2019-10-08 ENCOUNTER — Ambulatory Visit (HOSPITAL_COMMUNITY): Payer: PRIVATE HEALTH INSURANCE

## 2019-10-08 ENCOUNTER — Ambulatory Visit (HOSPITAL_COMMUNITY): Payer: PRIVATE HEALTH INSURANCE | Admitting: Anesthesiology

## 2019-10-08 ENCOUNTER — Observation Stay (HOSPITAL_COMMUNITY)
Admission: RE | Admit: 2019-10-08 | Discharge: 2019-10-08 | Disposition: A | Discharge status: Home / Self Care | Payer: PRIVATE HEALTH INSURANCE | Attending: Urology | Admitting: Urology

## 2019-10-08 ENCOUNTER — Encounter (HOSPITAL_COMMUNITY)
Admission: RE | Disposition: A | Discharge status: Home / Self Care | Payer: Self-pay | Source: Home / Self Care | Attending: Interventional Radiology

## 2019-10-08 ENCOUNTER — Ambulatory Visit (HOSPITAL_COMMUNITY)
Admission: RE | Admit: 2019-10-08 | Discharge: 2019-10-08 | Disposition: A | Discharge status: Home / Self Care | Payer: PRIVATE HEALTH INSURANCE | Source: Ambulatory Visit | Attending: Interventional Radiology | Admitting: Interventional Radiology

## 2019-10-08 ENCOUNTER — Encounter (HOSPITAL_COMMUNITY): Payer: Self-pay | Admitting: Interventional Radiology

## 2019-10-08 DIAGNOSIS — E785 Hyperlipidemia, unspecified: Secondary | ICD-10-CM | POA: Diagnosis not present

## 2019-10-08 DIAGNOSIS — C641 Malignant neoplasm of right kidney, except renal pelvis: Secondary | ICD-10-CM | POA: Diagnosis not present

## 2019-10-08 DIAGNOSIS — C61 Malignant neoplasm of prostate: Secondary | ICD-10-CM | POA: Diagnosis not present

## 2019-10-08 DIAGNOSIS — C649 Malignant neoplasm of unspecified kidney, except renal pelvis: Secondary | ICD-10-CM

## 2019-10-08 DIAGNOSIS — I10 Essential (primary) hypertension: Secondary | ICD-10-CM | POA: Insufficient documentation

## 2019-10-08 DIAGNOSIS — Z79899 Other long term (current) drug therapy: Secondary | ICD-10-CM | POA: Insufficient documentation

## 2019-10-08 DIAGNOSIS — Z01818 Encounter for other preprocedural examination: Secondary | ICD-10-CM

## 2019-10-08 HISTORY — PX: RADIOLOGY WITH ANESTHESIA: SHX6223

## 2019-10-08 LAB — BASIC METABOLIC PANEL
Anion gap: 10 (ref 5–15)
BUN: 21 mg/dL — ABNORMAL HIGH (ref 6–20)
CO2: 24 mmol/L (ref 22–32)
Calcium: 9.2 mg/dL (ref 8.9–10.3)
Chloride: 105 mmol/L (ref 98–111)
Creatinine, Ser: 0.84 mg/dL (ref 0.61–1.24)
GFR calc Af Amer: >60 mL/min (ref >60)
GFR calc non Af Amer: >60 mL/min (ref >60)
Glucose, Bld: 100 mg/dL — ABNORMAL HIGH (ref 70–99)
Potassium: 3.9 mmol/L (ref 3.5–5.1)
Sodium: 139 mmol/L (ref 135–145)

## 2019-10-08 LAB — CBC WITH DIFFERENTIAL/PLATELET
Abs Immature Granulocytes: 0.09 10*3/uL — ABNORMAL HIGH (ref 0.00–0.07)
Basophils Absolute: 0.1 10*3/uL (ref 0.0–0.1)
Basophils Relative: 0 %
Eosinophils Absolute: 0.2 10*3/uL (ref 0.0–0.5)
Eosinophils Relative: 1 %
HCT: 44.4 % (ref 39.0–52.0)
Hemoglobin: 15.1 g/dL (ref 13.0–17.0)
Immature Granulocytes: 1 %
Lymphocytes Relative: 17 %
Lymphs Abs: 2.3 10*3/uL (ref 0.7–4.0)
MCH: 31.9 pg (ref 26.0–34.0)
MCHC: 34 g/dL (ref 30.0–36.0)
MCV: 93.7 fL (ref 80.0–100.0)
Monocytes Absolute: 1.5 10*3/uL — ABNORMAL HIGH (ref 0.1–1.0)
Monocytes Relative: 11 %
Neutro Abs: 9.3 10*3/uL — ABNORMAL HIGH (ref 1.7–7.7)
Neutrophils Relative %: 70 %
Platelets: 237 10*3/uL (ref 150–400)
RBC: 4.74 MIL/uL (ref 4.22–5.81)
RDW: 13.9 % (ref 11.5–15.5)
WBC: 13.4 10*3/uL — ABNORMAL HIGH (ref 4.0–10.5)
nRBC: 0 % (ref 0.0–0.2)

## 2019-10-08 LAB — TYPE AND SCREEN
ABO/RH(D): O POS
Antibody Screen: NEGATIVE

## 2019-10-08 LAB — PROTIME-INR
INR: 0.9 (ref 0.8–1.2)
Prothrombin Time: 11.5 seconds (ref 11.4–15.2)

## 2019-10-08 SURGERY — IR WITH ANESTHESIA
Anesthesia: General | Laterality: Right

## 2019-10-08 MED ORDER — MIDAZOLAM HCL 2 MG/2ML IJ SOLN
INTRAMUSCULAR | Status: AC
  Filled 2019-10-08: qty 2

## 2019-10-08 MED ORDER — HYDROMORPHONE HCL 1 MG/ML IJ SOLN: 0.2500 mg | INTRAMUSCULAR | Status: DC | PRN

## 2019-10-08 MED ORDER — OXYCODONE HCL 5 MG/5ML PO SOLN: 5.0000 mg | Freq: Once | ORAL | Status: DC | PRN

## 2019-10-08 MED ORDER — FENTANYL CITRATE (PF) 250 MCG/5ML IJ SOLN
INTRAMUSCULAR | Status: AC
  Filled 2019-10-08: qty 5

## 2019-10-08 MED ORDER — SODIUM CHLORIDE 0.9 % IV SOLN
INTRAVENOUS | Status: AC
  Filled 2019-10-08: qty 250

## 2019-10-08 MED ORDER — ROCURONIUM BROMIDE 10 MG/ML (PF) SYRINGE
PREFILLED_SYRINGE | INTRAVENOUS | Status: DC | PRN
  Administered 2019-10-08 (×2): 10 mg via INTRAVENOUS
  Administered 2019-10-08: 50 mg via INTRAVENOUS
  Administered 2019-10-08: 10 mg via INTRAVENOUS
  Administered 2019-10-08: 20 mg via INTRAVENOUS

## 2019-10-08 MED ORDER — OXYCODONE HCL 5 MG PO TABS: 5.0000 mg | ORAL_TABLET | Freq: Once | ORAL | Status: DC | PRN

## 2019-10-08 MED ORDER — PROPOFOL 10 MG/ML IV BOLUS
INTRAVENOUS | Status: DC | PRN
  Administered 2019-10-08: 140 mg via INTRAVENOUS

## 2019-10-08 MED ORDER — CEFAZOLIN SODIUM-DEXTROSE 2-4 GM/100ML-% IV SOLN
2.0000 g | INTRAVENOUS | Status: AC
  Administered 2019-10-08: 2 g via INTRAVENOUS
  Filled 2019-10-08: qty 100

## 2019-10-08 MED ORDER — LIDOCAINE 2% (20 MG/ML) 5 ML SYRINGE
INTRAMUSCULAR | Status: DC | PRN
  Administered 2019-10-08: 100 mg via INTRAVENOUS

## 2019-10-08 MED ORDER — ONDANSETRON HCL 4 MG/2ML IJ SOLN
INTRAMUSCULAR | Status: DC | PRN
  Administered 2019-10-08: 4 mg via INTRAVENOUS

## 2019-10-08 MED ORDER — EPHEDRINE SULFATE-NACL 50-0.9 MG/10ML-% IV SOSY
PREFILLED_SYRINGE | INTRAVENOUS | Status: DC | PRN
  Administered 2019-10-08: 10 mg via INTRAVENOUS
  Administered 2019-10-08: 5 mg via INTRAVENOUS
  Administered 2019-10-08: 10 mg via INTRAVENOUS
  Administered 2019-10-08: 5 mg via INTRAVENOUS

## 2019-10-08 MED ORDER — PROMETHAZINE HCL 25 MG/ML IJ SOLN: 6.2500 mg | INTRAMUSCULAR | Status: DC | PRN

## 2019-10-08 MED ORDER — SODIUM CHLORIDE (PF) 0.9 % IJ SOLN
INTRAMUSCULAR | Status: AC
  Filled 2019-10-08: qty 50

## 2019-10-08 MED ORDER — CHLORHEXIDINE GLUCONATE 0.12 % MT SOLN
15.0000 mL | Freq: Once | OROMUCOSAL | Status: AC
  Administered 2019-10-08: 15 mL via OROMUCOSAL

## 2019-10-08 MED ORDER — DEXAMETHASONE SODIUM PHOSPHATE 10 MG/ML IJ SOLN
INTRAMUSCULAR | Status: DC | PRN
  Administered 2019-10-08: 4 mg via INTRAVENOUS

## 2019-10-08 MED ORDER — ACETAMINOPHEN 500 MG PO TABS
1000.0000 mg | ORAL_TABLET | Freq: Once | ORAL | Status: AC
  Administered 2019-10-08: 1000 mg via ORAL
  Filled 2019-10-08: qty 2

## 2019-10-08 MED ORDER — PHENYLEPHRINE 40 MCG/ML (10ML) SYRINGE FOR IV PUSH (FOR BLOOD PRESSURE SUPPORT)
PREFILLED_SYRINGE | INTRAVENOUS | Status: DC | PRN
  Administered 2019-10-08: 40 ug via INTRAVENOUS

## 2019-10-08 MED ORDER — SUGAMMADEX SODIUM 200 MG/2ML IV SOLN
INTRAVENOUS | Status: DC | PRN
  Administered 2019-10-08: 250 mg via INTRAVENOUS

## 2019-10-08 MED ORDER — FENTANYL CITRATE (PF) 100 MCG/2ML IJ SOLN
INTRAMUSCULAR | Status: DC | PRN
  Administered 2019-10-08: 100 ug via INTRAVENOUS
  Administered 2019-10-08: 50 ug via INTRAVENOUS

## 2019-10-08 MED ORDER — ALBUMIN HUMAN 5 % IV SOLN
INTRAVENOUS | Status: AC
  Filled 2019-10-08: qty 250

## 2019-10-08 MED ORDER — MIDAZOLAM HCL 2 MG/2ML IJ SOLN
INTRAMUSCULAR | Status: DC | PRN
  Administered 2019-10-08: 2 mg via INTRAVENOUS

## 2019-10-08 MED ORDER — LACTATED RINGERS IV SOLN: INTRAVENOUS | Status: DC

## 2019-10-08 NOTE — H&P (Signed)
Referring Physician(s): Borden,L  Supervising Physician: Aletta Edouard  Patient Status:  [facility] OP TBA  Chief Complaint: Right renal cell carcinoma   Subjective: Patient familiar to IR service from right renal mass biopsy on 08/14/2019 and virtual consultation with Dr. Kathlene Cote on 09/16/2019 to discuss treatment options for newly diagnosed right renal cell carcinoma.  He was deemed an appropriate candidate for CT-guided cryoablation of the renal cell carcinoma and presents today for the procedure.  He is status post right ureteral stent placement on 7/26 to minimize risk of scarring extending into the central collecting system from cryoablation.  Past medical history also remarkable for prostate cancer in 2017, status post prostatectomy.  He currently denies fever, headache, chest pain, dyspnea, cough, nausea, vomiting.  He does have some right flank/right lower quadrant discomfort secondary to ureteral stent as well as some blood-tinged urine.  Past Medical History:  Diagnosis Date  . Anxiety   . Elevated PSA   . Family history of prostate cancer   . Hyperlipidemia   . Hypertension    NO MEDS FOR HAS BEEN A LITTLE ELEVATED  . Prostate cancer ([place]) JAN 2017  . Renal mass    Past Surgical History:  Procedure Laterality Date  . CHOLECYSTECTOMY    . CYSTOSCOPY WITH STENT PLACEMENT Right 10/06/2019   Procedure: CYSTOSCOPY WITH STENT PLACEMENT;  Surgeon: Raynelle Bring, MD;  Location: [facility];  Service: Urology;  Laterality: Right;  . [place]  . IR RADIOLOGIST EVAL & MGMT  09/16/2019  . LYMPHADENECTOMY Bilateral 08/02/2015   Procedure: PELVIC LYMPHADENECTOMY;  Surgeon: Raynelle Bring, MD;  Location: [facility];  Service: Urology;  Laterality: Bilateral;  . PROSTATE BIOPSY    . ROBOT ASSISTED LAPAROSCOPIC RADICAL PROSTATECTOMY N/A 08/02/2015   Procedure: XI ROBOTIC ASSISTED LAPAROSCOPIC RADICAL PROSTATECTOMY LEVEL 2;  Surgeon: Raynelle Bring, MD;  Location: [facility];  Service:  Urology;  Laterality: N/A;     Allergies: Patient has no known allergies.  Medications: Prior to Admission medications   Medication Sig Start Date End Date Taking? Authorizing Provider  acetaminophen (TYLENOL) 500 MG tablet Take 1,000 mg by mouth every 6 (six) hours as needed (For pain.).   Yes [provider]  atorvastatin (LIPITOR) 10 MG tablet Take 10 mg by mouth at bedtime.    Yes [provider]  azelastine (ASTELIN) 0.1 % nasal spray Place 1 spray into both nostrils 2 (two) times daily as needed for allergies. Use in each nostril as directed   Yes [provider]  bisoprolol-hydrochlorothiazide (ZIAC) 5-6.25 MG tablet Take 1 tablet by mouth daily. 06/13/19  Yes [provider]  cetirizine (ZYRTEC) 10 MG tablet Take 10 mg by mouth daily.   Yes [provider]  fluticasone (FLONASE) 50 MCG/ACT nasal spray Place 1 spray into both nostrils daily as needed for allergies.    Yes [provider]  hydrOXYzine (VISTARIL) 25 MG capsule Take 50 mg by mouth at bedtime.  08/14/17  Yes [provider]  LORazepam (ATIVAN) 1 MG tablet Take 0.5 mg by mouth 2 (two) times daily.    Yes [provider]  Melatonin 10 MG CAPS Take 10 mg by mouth at bedtime as needed (sleep).    Yes [provider]  montelukast (SINGULAIR) 10 MG tablet Take 10 mg by mouth at bedtime.  07/09/17  Yes [provider]  naproxen sodium (ALEVE) 220 MG tablet Take 220 mg by mouth daily as needed (pain).   Yes [provider]  nicotine (NICOTROL) 10 MG inhaler Inhale 1 continuous puffing into the lungs as needed for smoking cessation.   Yes [provider]  pseudoephedrine-acetaminophen (TYLENOL SINUS) 30-500 MG TABS tablet Take 1 tablet by mouth every 4 (four) hours as needed (sinus headaches.).   Yes [provider]  tadalafil (CIALIS) 5 MG tablet Take 5 mg by mouth at bedtime.  09/15/19  Yes [provider]   EPINEPHrine 0.3 mg/0.3 mL IJ SOAJ injection Inject 0.3 mg into the muscle once as needed (For anaphylaxis.).    [provider]     Vital Signs: BP 111/76   Pulse 56   Temp 97.9 F (36.6 C) (Oral)   Resp 20   Ht 5\' 9"  (1.753 m)   Wt (!) 246 lb 0.5 oz (111.6 kg)   SpO2 98%   BMI 36.33 kg/m   Physical Exam awake, alert.  Chest clear to auscultation bilaterally.  Heart with regular rate and rhythm.  Abdomen soft, positive bowel sounds, currently nontender.  No lower extremity edema.  Imaging: DG C-Arm 1-60 Min-No Report  Result Date: 10/06/2019 Fluoroscopy was utilized by the requesting physician.  No radiographic interpretation.    Labs:  CBC: Recent Labs    08/14/19 0738 10/01/19 0942 10/08/19 0708  WBC 8.0 9.0 13.4*  HGB 15.3 15.3 15.1  HCT 45.2 46.3 44.4  PLT 246 259 237    COAGS: Recent Labs    08/14/19 0738 10/08/19 0708  INR 0.9 0.9    BMP: Recent Labs    10/01/19 0942 10/08/19 0708  NA 139 139  K 4.4 3.9  CL 105 105  CO2 26 24  GLUCOSE 113* 100*  BUN 17 21*  CALCIUM 9.6 9.2  CREATININE 0.91 0.84  GFRNONAA >60 >60  GFRAA >60 >60    LIVER FUNCTION TESTS: No results for input(s): BILITOT, AST, ALT, ALKPHOS, PROT, ALBUMIN in the last 8760 hours.  Assessment and Plan: Patient with history of prostate cancer in 2017, status post prostatectomy; now with 1.6 cm right renal cell carcinoma from biopsy performed on 08/14/2019.  He is status post virtual consultation with Dr. Kathlene Cote on 09/16/2019 to discuss treatment options.  He was deemed an appropriate candidate for CT-guided cryoablation of the right renal cell carcinoma and presents today for the procedure. He is status post right ureteral stent placement on 10/06/19 to minimize risk of scarring extending into the central collecting system from cryoablation.  Details/risks of procedure, including but not limited to, internal bleeding, infection, injury to adjacent structures, anesthesia related  complications discussed with patient with his understanding and consent.  Post procedure patient will be observed in the hospital for several hours and possibly for overnight observation. This procedure involves the use of CT and because of the nature of the planned procedure, it is possible that we will have prolonged use of CT.Marland Kitchen  Potential radiation risks to you include (but are not limited to) the following: - A slightly elevated risk for cancer  several years later in life. This risk is typically less than 0.5% percent. This risk is low in comparison to the normal incidence of human cancer, which is 33% for women and 50% for men according to the [employer]. - Radiation induced injury can include skin redness, resembling a rash, tissue breakdown / ulcers and hair loss (which can be temporary or permanent).   The likelihood of either of these occurring depends on the difficulty of the procedure and whether you are  sensitive to radiation due to previous procedures, disease, or genetic conditions.   IF your procedure requires a prolonged use of radiation, you will be notified and given written instructions for further action.  It is your responsibility to monitor the irradiated area for the 2 weeks following the procedure and to notify your physician if you are concerned that you have suffered a radiation induced injury.     Electronically Signed: D. Rowe Robert, PA-C 10/08/2019, 8:08 AM   I spent a total of 30 minutes at the the patient's bedside AND on the patient's hospital floor or unit, greater than 50% of which was counseling/coordinating care for CT-guided right renal cell carcinoma cryoablation

## 2019-10-08 NOTE — Anesthesia Procedure Notes (Signed)
Procedure Name: Intubation Date/Time: 10/08/2019 8:45 AM Performed by: Niel Hummer, CRNA Pre-anesthesia Checklist: Patient identified, Emergency Drugs available, Suction available and Patient being monitored Patient Re-evaluated:Patient Re-evaluated prior to induction Oxygen Delivery Method: Circle System Utilized Preoxygenation: Pre-oxygenation with 100% oxygen Induction Type: IV induction Ventilation: Oral airway inserted - appropriate to patient size Laryngoscope Size: Mac and 4 Grade View: Grade I Tube type: Oral Tube size: 7.5 mm Number of attempts: 1 Airway Equipment and Method: Stylet and Oral airway Placement Confirmation: ETT inserted through vocal cords under direct vision,  positive ETCO2 and breath sounds checked- equal and bilateral Secured at: 23 cm Tube secured with: Tape Dental Injury: Teeth and Oropharynx as per pre-operative assessment

## 2019-10-08 NOTE — Sedation Documentation (Signed)
Anesthesia in to sedate and monitor. 

## 2019-10-08 NOTE — Discharge Instructions (Signed)
Cryoablation, Care After This sheet gives you information about how to care for yourself after your procedure. Your health care provider may also give you more specific instructions. If you have problems or questions, contact your health care provider. What can I expect after the procedure? After the procedure, it is common to have:  Soreness around the treatment area.  Mild pain and swelling in the treatment area. Follow these instructions at home: Treatment area care   Follow instructions from your health care provider about how to take care of your incision. Make sure you: ? Wash your hands with soap and water before you change your bandage (dressing). If soap and water are not available, use hand sanitizer. ? Change your dressing as told by your health care provider. ? Leave stitches (sutures) in place. They may need to stay in place for 2 weeks or longer.  Check your treatment area every day for signs of infection. Check for: ? More redness, swelling, or pain. ? More fluid or blood. ? Warmth. ? Pus or a bad smell.  Keep the treated area clean, dry, and covered with a dressing until it has healed. Clean the area with soap and water or as told by your health care provider.  You may shower if your health care provider approves. If your bandage gets wet, change it right away. Activity  Follow instructions from your health care provider about any activity limitations.  Do not drive for 24 hours if you received a medicine to help you relax (sedative). General instructions  Take over-the-counter and prescription medicines only as told by your health care provider.  Keep all follow-up visits as told by your health care provider. This is important. Contact a health care provider if:  You do not have a bowel movement for 2 days.  You have nausea or vomiting.  You have more redness, swelling, or pain around your treatment area.  You have more fluid or blood coming from your  treatment area.  Your treatment area feels warm to the touch.  You have pus or a bad smell coming from your treatment area.  You have a fever. Get help right away if:  You have severe pain.  You have trouble swallowing or breathing.  You have severe weakness or dizziness.  You have chest pain or shortness of breath. This information is not intended to replace advice given to you by your health care provider. Make sure you discuss any questions you have with your health care provider. Document Revised: 02/09/2017 Document Reviewed: 07/28/2015 Elsevier Patient Education  2020 [employer]  

## 2019-10-08 NOTE — Discharge Summary (Signed)
Patient ID: GEROD CALIGIURI MRN: [id number] DOB/AGE: 05-30-1965 54 y.o.  Admit date: 10/08/2019 Discharge date: 10/08/2019  Supervising Physician: Aletta Edouard  Patient Status: Marc Ellis OP  Admission Diagnoses: Right renal cell carcinoma  Discharge Diagnoses: Right renal cell carcinoma, status post CT and ultrasound-guided cryoablation on 10/08/2019 Active Problems:   Renal carcinoma, right [facility])  Past Medical History:  Diagnosis Date  . Anxiety   . Elevated PSA   . Family history of prostate cancer   . Hyperlipidemia   . Hypertension    NO MEDS FOR HAS BEEN A LITTLE ELEVATED  . Prostate cancer ([place]) JAN 2017  . Renal mass    Past Surgical History:  Procedure Laterality Date  . CHOLECYSTECTOMY    . CYSTOSCOPY WITH STENT PLACEMENT Right 10/06/2019   Procedure: CYSTOSCOPY WITH STENT PLACEMENT;  Surgeon: Raynelle Bring, MD;  Location: [facility];  Service: Urology;  Laterality: Right;  . [place]  . IR RADIOLOGIST EVAL & MGMT  09/16/2019  . LYMPHADENECTOMY Bilateral 08/02/2015   Procedure: PELVIC LYMPHADENECTOMY;  Surgeon: Raynelle Bring, MD;  Location: [facility];  Service: Urology;  Laterality: Bilateral;  . PROSTATE BIOPSY    . ROBOT ASSISTED LAPAROSCOPIC RADICAL PROSTATECTOMY N/A 08/02/2015   Procedure: XI ROBOTIC ASSISTED LAPAROSCOPIC RADICAL PROSTATECTOMY LEVEL 2;  Surgeon: Raynelle Bring, MD;  Location: [facility];  Service: Urology;  Laterality: N/A;     Discharged Condition: good  Hospital Course: Mr. Alessio is a 54 yo male with history of prostate cancer in 2017, status post prostatectomy; now with right renal cell carcinoma from biopsy performed on 08/14/2019.  He is status post virtual consultation with Dr. Kathlene Cote on 09/16/2019 to discuss treatment options.  He was deemed an appropriate candidate for CT-guided cryoablation of the right renal cell carcinoma and  underwent the procedure on 10/08/2019. The procedure was performed via general anesthesia and without  immediate complications. Post procedure he was extubated and observed in the PACU for several hours. His Foley catheter was removed and he was able to void  slightly blood-tinged urine without difficulty. He tolerated his diet okay. He was seen by Dr. Kathlene Cote and deemed stable for discharge at this time. Dr. Kathlene Cote will follow up with patient in IR clinic in 1 month. Follow-up imaging will be performed in approximately 3 months. Patient will continue care with Dr. Alinda Money as scheduled. He will continue on his current home medications. He was told to contact our service with any additional questions.  Consults: none  Significant Diagnostic Studies:  Results for orders placed or performed during the hospital encounter of 67/34/19  Basic metabolic panel  Result Value Ref Range   Sodium 139 135 - 145 mmol/L   Potassium 3.9 3.5 - 5.1 mmol/L   Chloride 105 98 - 111 mmol/L   CO2 24 22 - 32 mmol/L   Glucose, Bld 100 (H) 70 - 99 mg/dL   BUN 21 (H) 6 - 20 mg/dL   Creatinine, Ser 0.84 0.61 - 1.24 mg/dL   Calcium 9.2 8.9 - 10.3 mg/dL   GFR calc non Af Amer >60 >60 mL/min   GFR calc Af Amer >60 >60 mL/min   Anion gap 10 5 - 15  CBC with Differential/Platelet  Result Value Ref Range   WBC 13.4 (H) 4.0 - 10.5 K/uL   RBC 4.74 4.22 - 5.81 MIL/uL   Hemoglobin 15.1 13.0 - 17.0 g/dL   HCT 44.4 39 - 52 %   MCV 93.7 80.0 - 100.0  fL   MCH 31.9 26.0 - 34.0 pg   MCHC 34.0 30.0 - 36.0 g/dL   RDW 13.9 11.5 - 15.5 %   Platelets 237 150 - 400 K/uL   nRBC 0.0 0.0 - 0.2 %   Neutrophils Relative % 70 %   Neutro Abs 9.3 (H) 1.7 - 7.7 K/uL   Lymphocytes Relative 17 %   Lymphs Abs 2.3 0.7 - 4.0 K/uL   Monocytes Relative 11 %   Monocytes Absolute 1.5 (H) 0 - 1 K/uL   Eosinophils Relative 1 %   Eosinophils Absolute 0.2 0 - 0 K/uL   Basophils Relative 0 %   Basophils Absolute 0.1 0 - 0 K/uL   Immature Granulocytes 1 %   Abs Immature Granulocytes 0.09 (H) 0.00 - 0.07 K/uL  Protime-INR  Result Value Ref Range    Prothrombin Time 11.5 11.4 - 15.2 seconds   INR 0.9 0.8 - 1.2  Type and screen  Result Value Ref Range   ABO/RH(D) O POS    Antibody Screen NEG    Sample Expiration      10/11/2019,2359 Performed at [facility], [place] [address] [place], [place] [address]      Treatments: CT and ultrasound guided 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 rate 15, height 5\' 9"  (1.753 m), weight (!) 246 lb 0.5 oz (111.6 kg), SpO2 95 %. Awake, alert. Chest clear to auscultation bilaterally. Heart with regular rate and rhythm. Abdomen soft, positive bowel sounds, minimal right lateral/suprapubic tenderness to palpation; puncture site right flank clean, dry, not significantly tender, no distinct hematoma; no significant lower extremity edema.  Disposition: Discharge disposition: 01-Home or Self Care       Discharge Instructions    Call MD for:  difficulty breathing, headache or visual disturbances   Complete by: As directed    Call MD for:  extreme fatigue   Complete by: As directed    Call MD for:  hives   Complete by: As directed    Call MD for:  persistant dizziness or light-headedness   Complete by: As directed    Call MD for:  persistant nausea and vomiting   Complete by: As directed    Call MD for:  redness, tenderness, or signs of infection (pain, swelling, redness, odor or green/yellow discharge around incision site)   Complete by: As directed    Call MD for:  severe uncontrolled pain   Complete by: As directed    Call MD for:  temperature >100.4   Complete by: As directed    Diet - low sodium heart healthy   Complete by: As directed    Discharge instructions   Complete by: As directed    Stay well-hydrated, avoid heavy lifting for the next 3 to 4 days; do not drive for the next 24 hours   Driving Restrictions   Complete by: As  directed    No driving for the next 24 hours   Increase activity slowly   Complete by: As directed    Lifting restrictions   Complete by: As directed    Avoid heavy lifting for the next 3 to 4 days   May shower / Bathe   Complete by: As directed    May walk up steps   Complete by: As directed      Allergies as of 10/08/2019   No Known  Allergies     Medication List    TAKE these medications   acetaminophen 500 MG tablet Commonly known as: TYLENOL Take 1,000 mg by mouth every 6 (six) hours as needed (For pain.).   atorvastatin 10 MG tablet Commonly known as: LIPITOR Take 10 mg by mouth at bedtime.   azelastine 0.1 % nasal spray Commonly known as: ASTELIN Place 1 spray into both nostrils 2 (two) times daily as needed for allergies. Use in each nostril as directed   bisoprolol-hydrochlorothiazide 5-6.25 MG tablet Commonly known as: ZIAC Take 1 tablet by mouth daily.   cetirizine 10 MG tablet Commonly known as: ZYRTEC Take 10 mg by mouth daily.   EPINEPHrine 0.3 mg/0.3 mL Soaj injection Commonly known as: EPI-PEN Inject 0.3 mg into the muscle once as needed (For anaphylaxis.).   Flonase 50 MCG/ACT nasal spray Generic drug: fluticasone Place 1 spray into both nostrils daily as needed for allergies.   hydrOXYzine 25 MG capsule Commonly known as: VISTARIL Take 50 mg by mouth at bedtime.   LORazepam 1 MG tablet Commonly known as: ATIVAN Take 0.5 mg by mouth 2 (two) times daily.   Melatonin 10 MG Caps Take 10 mg by mouth at bedtime as needed (sleep).   montelukast 10 MG tablet Commonly known as: SINGULAIR Take 10 mg by mouth at bedtime.   naproxen sodium 220 MG tablet Commonly known as: ALEVE Take 220 mg by mouth daily as needed (pain).   nicotine 10 MG inhaler Commonly known as: NICOTROL Inhale 1 continuous puffing into the lungs as needed for smoking cessation.   pseudoephedrine-acetaminophen 30-500 MG Tabs tablet Commonly known as: TYLENOL SINUS Take  1 tablet by mouth every 4 (four) hours as needed (sinus headaches.).   tadalafil 5 MG tablet Commonly known as: CIALIS Take 5 mg by mouth at bedtime.       Follow-up Information    Aletta Edouard, MD Follow up.   Specialties: Interventional Radiology, Radiology Why: Dr. Kathlene Cote will contact you in 1 month for follow-up; call [phone] for any additional questions Contact information: [address] [place] [address] [phone]        Raynelle Bring, MD Follow up.   Specialty: Urology Why: Continue follow-up with Dr. Alinda Money as scheduled Contact information: [place] [address] [phone]                Electronically Signed: D. Rowe Robert, PA-C 10/08/2019, 4:11 PM   I have spent Less Than 30 Minutes discharging Marc Ellis.

## 2019-10-08 NOTE — Anesthesia Postprocedure Evaluation (Signed)
Anesthesia Post Note  Patient: Marc Ellis  Procedure(s) Performed: RIGHT RENAL CRYO ABLATION (Right )     Patient location during evaluation: PACU Anesthesia Type: General Level of consciousness: awake Pain management: pain level controlled Vital Signs Assessment: post-procedure vital signs reviewed and stable Respiratory status: spontaneous breathing, nonlabored ventilation, respiratory function stable and patient connected to nasal cannula oxygen Cardiovascular status: blood pressure returned to baseline and stable Postop Assessment: no apparent nausea or vomiting Anesthetic complications: no   No complications documented.  Last Vitals:  Vitals:   10/08/19 1315 10/08/19 1415  BP: (!) 130/83 (!) 133/87  Pulse: 65 65  Resp: 14 14  Temp: 36.7 C 36.6 C  SpO2: 99% 100%    Last Pain:  Vitals:   10/08/19 1415  TempSrc:   PainSc: 0-No pain                 Shantanu Strauch P Egidio Lofgren

## 2019-10-08 NOTE — Procedures (Signed)
Interventional Radiology Procedure Note  Procedure: CT and US guided cryoablation of right renal papillary carcinoma  Anesthesia: General  Complications: None  Estimated Blood Loss: < 10 mL  Findings: Cryoablation of biopsy proven right renal carcinoma with 2 Galil Ice Rod probes. By [place], tumor measures approximately 2 cm in greatest diameter today. Procedure uncomplicated.  Plan: PACU recovery. 6 hour bedrest. Possible discharge later today/tonight or overnight observation.  Venetia Night. Kathlene Cote, M.D Pager:  [phone]

## 2019-10-08 NOTE — Transfer of Care (Signed)
Immediate Anesthesia Transfer of Care Note  Patient: COUNCIL MUNGUIA  Procedure(s) Performed: RIGHT RENAL CRYO ABLATION (Right )  Patient Location: PACU  Anesthesia Type:General  Level of Consciousness: awake, alert  and oriented  Airway & Oxygen Therapy: Patient Spontanous Breathing and Patient connected to face mask oxygen  Post-op Assessment: Report given to RN, Post -op Vital signs reviewed and stable and Patient moving all extremities X 4  Post vital signs: Reviewed and stable  Last Vitals:  Vitals Value Taken Time  BP    Temp    Pulse 76 10/08/19 1111  Resp    SpO2 94 % 10/08/19 1111  Vitals shown include unvalidated device data.  Last Pain:  Vitals:   10/08/19 0714  TempSrc: Oral  PainSc: 0-No pain         Complications: No complications documented.

## 2019-10-09 ENCOUNTER — Encounter (HOSPITAL_COMMUNITY): Payer: Self-pay | Admitting: Interventional Radiology

## 2019-11-11 ENCOUNTER — Other Ambulatory Visit: Payer: Self-pay

## 2019-11-11 ENCOUNTER — Ambulatory Visit
Admission: RE | Admit: 2019-11-11 | Discharge: 2019-11-11 | Disposition: A | Discharge status: Home / Self Care | Payer: PRIVATE HEALTH INSURANCE | Source: Ambulatory Visit | Attending: Radiology | Admitting: Radiology

## 2019-11-11 DIAGNOSIS — C641 Malignant neoplasm of right kidney, except renal pelvis: Secondary | ICD-10-CM

## 2019-11-11 HISTORY — PX: IR RADIOLOGIST EVAL & MGMT: IMG5224

## 2019-11-11 NOTE — Progress Notes (Signed)
Chief Complaint: Patient was consulted remotely today (TeleHealth) for follow-up after cryoablation of a right renal papillary carcinoma on 10/08/2019.  History of Present Illness: Marc Ellis is a 54 y.o. male status post cryoablation of a roughly 2 cm endophytic, interpolar right renal papillary carcinoma on 10/08/2019.  The procedure was uncomplicated.  A ureteral stent was placed on 10/06/2019 prior to ablation to protect the renal pelvis due to the fairly central location of the carcinoma.  The patient states that this past Saturday he had significant back pain on the right and could not urinate.  After ingesting additional fluids, he states that the pain resolved and he was able to urinate.  His ureteral stent was removed this morning and over the phone now he states that he is currently in the process of driving back to [facility] to be reassessed as he is now having severe right-sided back pain after stent removal and cannot urinate.  He has had some associated nausea and dry heaving at home.  He denies hematuria.  Past Medical History:  Diagnosis Date  . Anxiety   . Elevated PSA   . Family history of prostate cancer   . Hyperlipidemia   . Hypertension    NO MEDS FOR HAS BEEN A LITTLE ELEVATED  . Prostate cancer ([place]) JAN 2017  . Renal mass     Past Surgical History:  Procedure Laterality Date  . CHOLECYSTECTOMY    . CYSTOSCOPY WITH STENT PLACEMENT Right 10/06/2019   Procedure: CYSTOSCOPY WITH STENT PLACEMENT;  Surgeon: Raynelle Bring, MD;  [facility];  Service: Urology;  Laterality: Right;  . [place]  . IR RADIOLOGIST EVAL & MGMT  09/16/2019  . LYMPHADENECTOMY Bilateral 08/02/2015   Procedure: PELVIC LYMPHADENECTOMY;  Surgeon: Raynelle Bring, MD;  [facility];  Service: Urology;  Laterality: Bilateral;  . PROSTATE BIOPSY    . RADIOLOGY WITH ANESTHESIA Right 10/08/2019   Procedure: RIGHT RENAL CRYO ABLATION;  Surgeon: Aletta Edouard,  MD;  Location: [facility];  Service: Radiology;  Laterality: Right;  . ROBOT ASSISTED LAPAROSCOPIC RADICAL PROSTATECTOMY N/A 08/02/2015   Procedure: XI ROBOTIC ASSISTED LAPAROSCOPIC RADICAL PROSTATECTOMY LEVEL 2;  Surgeon: Raynelle Bring, MD;  [facility];  Service: Urology;  Laterality: N/A;    Allergies: Patient has no known allergies.  Medications: Prior to Admission medications   Medication Sig Start Date End Date Taking? Authorizing Provider  acetaminophen (TYLENOL) 500 MG tablet Take 1,000 mg by mouth every 6 (six) hours as needed (For pain.).    [provider]  atorvastatin (LIPITOR) 10 MG tablet Take 10 mg by mouth at bedtime.     [provider]  azelastine (ASTELIN) 0.1 % nasal spray Place 1 spray into both nostrils 2 (two) times daily as needed for allergies. Use in each nostril as directed    [provider]  bisoprolol-hydrochlorothiazide (ZIAC) 5-6.25 MG tablet Take 1 tablet by mouth daily. 06/13/19   [provider]  cetirizine (ZYRTEC) 10 MG tablet Take 10 mg by mouth daily.    [provider]  EPINEPHrine 0.3 mg/0.3 mL IJ SOAJ injection Inject 0.3 mg into the muscle once as needed (For anaphylaxis.).    [provider]  fluticasone (FLONASE) 50 MCG/ACT nasal spray Place 1 spray into both nostrils daily as needed for allergies.     [provider]  hydrOXYzine (VISTARIL) 25 MG capsule Take 50 mg by mouth at bedtime.  08/14/17   [provider]  LORazepam (ATIVAN) 1 MG tablet Take 0.5 mg by mouth 2 (two) times daily.     [provider]  Melatonin 10 MG CAPS Take 10 mg by mouth at bedtime as needed (sleep).     [provider]  montelukast (SINGULAIR) 10 MG tablet Take 10 mg by mouth at bedtime.  07/09/17   [provider]  naproxen sodium (ALEVE) 220 MG tablet Take 220 mg by mouth daily as needed (pain).    [provider]  nicotine (NICOTROL) 10 MG inhaler Inhale 1 continuous  puffing into the lungs as needed for smoking cessation.    [provider]  pseudoephedrine-acetaminophen (TYLENOL SINUS) 30-500 MG TABS tablet Take 1 tablet by mouth every 4 (four) hours as needed (sinus headaches.).    [provider]  tadalafil (CIALIS) 5 MG tablet Take 5 mg by mouth at bedtime.  09/15/19   [provider]     Family History  Problem Relation Age of Onset  . Prostate cancer Father 74  . Prostate cancer Paternal Uncle        dx in his 29s  . Kidney failure Sister   . Diabetes Maternal Aunt   . Hypertension Maternal Grandmother   . Prostate cancer Paternal Grandfather        dx in his 64s    Social History   Socioeconomic History  . Marital status: Widowed    Spouse name: Not on file  . Number of children: 3  . Years of education: Not on file  . Highest education level: Not on file  Occupational History  . Not on file  Tobacco Use  . Smoking status: Current Every Day Smoker    Packs/day: 1.00    Years: 20.00    Pack years: 20.00    Types: Cigarettes  . Smokeless tobacco: Never Used  Vaping Use  . Vaping Use: Never used  Substance and Sexual Activity  . Alcohol use: No  . Drug use: No  . Sexual activity: Yes    Birth control/protection: None  Other Topics Concern  . Not on file  Social History Narrative  . Not on file   Social Determinants of Health   Financial Resource Strain:   . Difficulty of Paying Living Expenses: Not on file  Food Insecurity:   . Worried About Charity fundraiser in the Last Year: Not on file  . Ran Out of Food in the Last Year: Not on file  Transportation Needs:   . Lack of Transportation (Medical): Not on file  . Lack of Transportation (Non-Medical): Not on file  Physical Activity:   . Days of Exercise per Week: Not on file  . Minutes of Exercise per Session: Not on file  Stress:   . Feeling of Stress : Not on file  Social Connections:   . Frequency of Communication with Friends and  Family: Not on file  . Frequency of Social Gatherings with Friends and Family: Not on file  . Attends Religious Services: Not on file  . Active Member of Clubs or Organizations: Not on file  . Attends Archivist Meetings: Not on file  . Marital Status: Not on file    ECOG Status: 0 - Asymptomatic  Review of Systems  Constitutional: Negative.   Respiratory: Negative.   Cardiovascular: Negative.   Gastrointestinal: Negative.   Genitourinary: Positive for difficulty urinating and flank pain.  Musculoskeletal: Positive for back pain.  Neurological: Negative.  Review of Systems: A 12 point ROS discussed and pertinent positives are indicated in the HPI above.  All other systems are negative.  Physical Exam No direct physical exam was performed (except for noted visual exam findings with Video Visits).   Vital Signs: There were no vitals taken for this visit.  Imaging: No results found.  Labs:  CBC: Recent Labs    08/14/19 0738 10/01/19 0942 10/08/19 0708  WBC 8.0 9.0 13.4*  HGB 15.3 15.3 15.1  HCT 45.2 46.3 44.4  PLT 246 259 237    COAGS: Recent Labs    08/14/19 0738 10/08/19 0708  INR 0.9 0.9    BMP: Recent Labs    10/01/19 0942 10/08/19 0708  NA 139 139  K 4.4 3.9  CL 105 105  CO2 26 24  GLUCOSE 113* 100*  BUN 17 21*  CALCIUM 9.6 9.2  CREATININE 0.91 0.84  GFRNONAA >60 >60  GFRAA >60 >60    LIVER FUNCTION TESTS: No results for input(s): BILITOT, AST, ALT, ALKPHOS, PROT, ALBUMIN in the last 8760 hours.  TUMOR MARKERS: No results for input(s): AFPTM, CEA, CA199, CHROMGRNA in the last 8760 hours.  Assessment and Plan:  Mr. Orth is currently in the process of being assessed for acute pain after ureteral stent removal earlier today.  Based on symptoms he may have some relative obstruction of the right renal collecting system and/or ureter after stent removal.   I recommended that we perform either CT or MRI for follow-up of  ablation in late September.  CT would likely be sufficient for initial assessment post ablation.  Electronically Signed: Azzie Roup 11/11/2019, 2:30 PM     I spent a total of 10 Minutes in remote  clinical consultation, greater than 50% of which was counseling/coordinating care post cryoablation of a right renal papillary carcinoma.    Visit type: Audio only (telephone). Audio (no video) only due to patient's lack of internet/smartphone capability. Alternative for in-person consultation at [facility], [address], [place], [place]. This visit type was conducted due to national recommendations for restrictions regarding the COVID-19 Pandemic (e.g. social distancing).  This format is felt to be most appropriate for this patient at this time.  All issues noted in this document were discussed and addressed.

## 2019-11-20 ENCOUNTER — Other Ambulatory Visit: Payer: Self-pay | Admitting: Interventional Radiology

## 2019-11-20 ENCOUNTER — Other Ambulatory Visit: Payer: Self-pay | Admitting: Diagnostic Radiology

## 2019-11-20 DIAGNOSIS — C641 Malignant neoplasm of right kidney, except renal pelvis: Secondary | ICD-10-CM

## 2019-11-21 ENCOUNTER — Other Ambulatory Visit: Payer: Self-pay

## 2019-11-21 DIAGNOSIS — N2889 Other specified disorders of kidney and ureter: Secondary | ICD-10-CM

## 2020-01-02 ENCOUNTER — Ambulatory Visit (HOSPITAL_COMMUNITY)
Admission: RE | Admit: 2020-01-02 | Discharge: 2020-01-02 | Disposition: A | Discharge status: Home / Self Care | Payer: PRIVATE HEALTH INSURANCE | Source: Ambulatory Visit | Attending: Interventional Radiology | Admitting: Interventional Radiology

## 2020-01-02 ENCOUNTER — Encounter (HOSPITAL_COMMUNITY): Payer: Self-pay

## 2020-01-02 ENCOUNTER — Other Ambulatory Visit: Payer: Self-pay

## 2020-01-02 DIAGNOSIS — C641 Malignant neoplasm of right kidney, except renal pelvis: Secondary | ICD-10-CM | POA: Diagnosis present

## 2020-01-02 LAB — POCT I-STAT CREATININE: Creatinine, Ser: 0.9 mg/dL (ref 0.61–1.24)

## 2020-01-02 MED ORDER — IOHEXOL 300 MG/ML  SOLN
100.0000 mL | Freq: Once | INTRAMUSCULAR | Status: AC | PRN
  Administered 2020-01-02: 100 mL via INTRAVENOUS

## 2020-01-06 ENCOUNTER — Telehealth: Payer: PRIVATE HEALTH INSURANCE

## 2020-01-07 ENCOUNTER — Encounter: Payer: Self-pay | Admitting: *Deleted

## 2020-01-07 ENCOUNTER — Ambulatory Visit
Admission: RE | Admit: 2020-01-07 | Discharge: 2020-01-07 | Disposition: A | Discharge status: Home / Self Care | Payer: PRIVATE HEALTH INSURANCE | Source: Ambulatory Visit | Attending: Interventional Radiology | Admitting: Interventional Radiology

## 2020-01-07 ENCOUNTER — Other Ambulatory Visit: Payer: Self-pay

## 2020-01-07 DIAGNOSIS — C641 Malignant neoplasm of right kidney, except renal pelvis: Secondary | ICD-10-CM

## 2020-01-07 HISTORY — PX: IR RADIOLOGIST EVAL & MGMT: IMG5224

## 2020-01-07 NOTE — Progress Notes (Signed)
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  History of Present Illness: Marc Ellis is a 54 y.o. male status post cryoablation of a roughly 2 cm endophytic, interpolar right renal papillary carcinoma on 10/08/2019.  The procedure was uncomplicated.  A ureteral stent was placed on 10/06/2019 prior to ablation to protect the renal pelvis due to the fairly central location of the carcinoma. The stent was removed on 11/11/2019. After some initial pain and spasm immediately after stent removal, Marc Ellis has been doing well with no pain or urinary symptoms.  Past Medical History:  Diagnosis Date   Anxiety    Elevated PSA    Family history of prostate cancer    Hyperlipidemia    Hypertension    NO MEDS FOR HAS BEEN A LITTLE ELEVATED   Prostate cancer ([place]) JAN 2017   Renal mass     Past Surgical History:  Procedure Laterality Date   CHOLECYSTECTOMY     CYSTOSCOPY WITH STENT PLACEMENT Right 10/06/2019   Procedure: CYSTOSCOPY WITH STENT PLACEMENT;  Surgeon: Raynelle Bring, MD;  Location: [facility];  Service: Urology;  Laterality: Right;   INGUINAL HERNIA REPAIR  1972   IR RADIOLOGIST EVAL & MGMT  09/16/2019   IR RADIOLOGIST EVAL & MGMT  11/11/2019   LYMPHADENECTOMY Bilateral 08/02/2015   Procedure: PELVIC LYMPHADENECTOMY;  Surgeon: Raynelle Bring, MD;  Location: [facility];  Service: Urology;  Laterality: Bilateral;   PROSTATE BIOPSY     RADIOLOGY WITH ANESTHESIA Right 10/08/2019   Procedure: RIGHT RENAL CRYO ABLATION;  Surgeon: Aletta Edouard, MD;  Location: [facility];  Service: Radiology;  Laterality: Right;   ROBOT ASSISTED LAPAROSCOPIC RADICAL PROSTATECTOMY N/A 08/02/2015   Procedure: XI ROBOTIC ASSISTED LAPAROSCOPIC RADICAL PROSTATECTOMY LEVEL 2;  Surgeon: Raynelle Bring, MD;  Location: [facility];  Service: Urology;  Laterality: N/A;    Allergies: Patient has no known  allergies.  Medications: Prior to Admission medications   Medication Sig Start Date End Date Taking? Authorizing Provider  acetaminophen (TYLENOL) 500 MG tablet Take 1,000 mg by mouth every 6 (six) hours as needed (For pain.).    [provider]  atorvastatin (LIPITOR) 10 MG tablet Take 10 mg by mouth at bedtime.     [provider]  azelastine (ASTELIN) 0.1 % nasal spray Place 1 spray into both nostrils 2 (two) times daily as needed for allergies. Use in each nostril as directed    [provider]  bisoprolol-hydrochlorothiazide (ZIAC) 5-6.25 MG tablet Take 1 tablet by mouth daily. 06/13/19   [provider]  cetirizine (ZYRTEC) 10 MG tablet Take 10 mg by mouth daily.    [provider]  EPINEPHrine 0.3 mg/0.3 mL IJ SOAJ injection Inject 0.3 mg into the muscle once as needed (For anaphylaxis.).    [provider]  fluticasone (FLONASE) 50 MCG/ACT nasal spray Place 1 spray into both nostrils daily as needed for allergies.     [provider]  hydrOXYzine (VISTARIL) 25 MG capsule Take 50 mg by mouth at bedtime.  08/14/17   [provider]  LORazepam (ATIVAN) 1 MG tablet Take 0.5 mg by mouth 2 (two) times daily.     [provider]  Melatonin 10 MG CAPS Take 10 mg by mouth at bedtime as needed (sleep).     [provider]  montelukast (SINGULAIR) 10 MG tablet Take 10 mg by mouth at bedtime.  07/09/17  [provider]  naproxen sodium (ALEVE) 220 MG tablet Take 220 mg by mouth daily as needed (pain).    [provider]  nicotine (NICOTROL) 10 MG inhaler Inhale 1 continuous puffing into the lungs as needed for smoking cessation.    [provider]  pseudoephedrine-acetaminophen (TYLENOL SINUS) 30-500 MG TABS tablet Take 1 tablet by mouth every 4 (four) hours as needed (sinus headaches.).    [provider]  tadalafil (CIALIS) 5 MG tablet Take 5 mg by mouth at bedtime.  09/15/19    [provider]     Family History  Problem Relation Age of Onset   Prostate cancer Father 70   Prostate cancer Paternal Uncle        dx in his 80s   Kidney failure Sister    Diabetes Maternal Aunt    Hypertension Maternal Grandmother    Prostate cancer Paternal Grandfather        dx in his 75s    Social History   Socioeconomic History   Marital status: Widowed    Spouse name: Not on file   Number of children: 3   Years of education: Not on file   Highest education level: Not on file  Occupational History   Not on file  Tobacco Use   Smoking status: Current Every Day Smoker    Packs/day: 1.00    Years: 20.00    Pack years: 20.00    Types: Cigarettes   Smokeless tobacco: Never Used  Vaping Use   Vaping Use: Never used  Substance and Sexual Activity   Alcohol use: No   Drug use: No   Sexual activity: Yes    Birth control/protection: None  Other Topics Concern   Not on file  Social History Narrative   Not on file   Social Determinants of Health   Financial Resource Strain:    Difficulty of Paying Living Expenses: Not on file  Food Insecurity:    Worried About Charity fundraiser in the Last Year: Not on file   [employer] of Food in the Last Year: Not on file  Transportation Needs:    Lack of Transportation (Medical): Not on file   Lack of Transportation (Non-Medical): Not on file  Physical Activity:    Days of Exercise per Week: Not on file   Minutes of Exercise per Session: Not on file  Stress:    Feeling of Stress : Not on file  Social Connections:    Frequency of Communication with Friends and Family: Not on file   Frequency of Social Gatherings with Friends and Family: Not on file   Attends Religious Services: Not on file   Active Member of Clubs or Organizations: Not on file   Attends Archivist Meetings: Not on file   Marital Status: Not on file    ECOG Status: 0 - Asymptomatic  Review of  Systems  Constitutional: Negative.   Respiratory: Negative.   Cardiovascular: Negative.   Gastrointestinal: Negative.   Genitourinary: Negative.   Musculoskeletal: Negative.   Neurological: Negative.     Review of Systems: A 12 point ROS discussed and pertinent positives are indicated in the HPI above.  All other systems are negative.  Physical Exam No direct physical exam was performed (except for noted visual exam findings with Video Visits).   Vital Signs: There were no vitals taken for this visit.  Imaging: CT ABDOMEN W WO CONTRAST  Result Date: 01/02/2020 CLINICAL DATA:  54 year old male  status post cryoablation for right renal papillary carcinoma on 10/08/2019. Follow-up study. EXAM: CT ABDOMEN WITHOUT AND WITH CONTRAST TECHNIQUE: Multidetector CT imaging of the abdomen was performed following the standard protocol before and following the bolus administration of intravenous contrast. CONTRAST:  125mL OMNIPAQUE IOHEXOL 300 MG/ML  SOLN COMPARISON:  CT the abdomen and pelvis 05/29/2019. Abdominal MRI 07/02/2019. FINDINGS: Lower chest: Unremarkable. Hepatobiliary: No suspicious cystic or solid hepatic lesions. No intra or extrahepatic biliary ductal dilatation. Status post cholecystectomy. Pancreas: No pancreatic mass. No pancreatic ductal dilatation. No pancreatic or peripancreatic fluid collections or inflammatory changes. Spleen: Unremarkable. Adrenals/Urinary Tract: In the interpolar region of the right kidney there is an area of architectural distortion which measures 23 HU on precontrast images, 27 HU on arterial phase images, 26 HU on portal venous phase images, and 34 HU on delayed images, compatible with a defect from reported cryoablation. No definitive nodular enhancing soft tissue in this region to strongly suggest residual/recurrent disease. Left kidney and bilateral adrenal glands are normal in appearance. No hydroureteronephrosis in the visualized portions of the abdomen.  Stomach/Bowel: Normal appearance of the stomach. No pathologic dilatation of visualized portions of small bowel or colon. Vascular/Lymphatic: Aortic atherosclerosis, without evidence of aneurysm or dissection in the abdominal vasculature. No lymphadenopathy noted in the abdomen. Other: No significant volume of ascites and no pneumoperitoneum noted in the visualized portions of the peritoneal cavity. Musculoskeletal: There are no aggressive appearing lytic or blastic lesions noted in the visualized portions of the skeleton. IMPRESSION: 1. Postprocedural changes of cryoablation in the interpolar region of the right kidney, without definitive findings to suggest residual or recurrent disease. Continued attention on future follow-up imaging is recommended. 2. No definite signs of metastatic disease in the abdomen. 3. Aortic atherosclerosis. Electronically Signed   By: Vinnie Langton M.D.   On: 01/02/2020 14:41    Labs:  CBC: Recent Labs    08/14/19 0738 10/01/19 0942 10/08/19 0708  WBC 8.0 9.0 13.4*  HGB 15.3 15.3 15.1  HCT 45.2 46.3 44.4  PLT 246 259 237    COAGS: Recent Labs    08/14/19 0738 10/08/19 0708  INR 0.9 0.9    BMP: Recent Labs    10/01/19 0942 10/08/19 0708 01/02/20 0958  NA 139 139  --   K 4.4 3.9  --   CL 105 105  --   CO2 26 24  --   GLUCOSE 113* 100*  --   BUN 17 21*  --   CALCIUM 9.6 9.2  --   CREATININE 0.91 0.84 0.90  GFRNONAA >60 >60  --   GFRAA >60 >60  --     Assessment and Plan:  I spoke with Mr. Helfman over the phone. I reviewed results from a follow-up CT of the abdomen on 01/02/2020 that demonstrates a well-circumscribed ablation defect in the interpolar right kidney which demonstrates no postcontrast enhancement. No evidence of complication following the procedure or hydronephrosis. No evidence of metastatic disease in the abdomen. No other renal lesions identified.  I recommended another follow-up CT in 6 to 9 months.  Electronically  Signed: Azzie Roup 01/07/2020, 10:19 AM     I spent a total of 10 Minutes in remote  clinical consultation, greater than 50% of which was counseling/coordinating care post cryoablation of a biopsy-proven right papillary renal carcinoma.    Visit type: Audio only (telephone). Audio (no video) only due to patient's lack of internet/smartphone capability. Alternative for in-person consultation at [facility], [address], [place], [place]. This visit type was conducted due to national recommendations for restrictions regarding the COVID-19 Pandemic (e.g. social distancing).  This format is felt to be most appropriate for this patient at this time.  All issues noted in this document were discussed and addressed.

## 2020-06-14 ENCOUNTER — Ambulatory Visit (HOSPITAL_COMMUNITY)
Admission: RE | Admit: 2020-06-14 | Discharge: 2020-06-14 | Disposition: A | Discharge status: Home / Self Care | Payer: PRIVATE HEALTH INSURANCE | Source: Ambulatory Visit | Attending: Urology | Admitting: Urology

## 2020-06-14 ENCOUNTER — Other Ambulatory Visit (HOSPITAL_COMMUNITY): Payer: Self-pay | Admitting: Urology

## 2020-06-14 ENCOUNTER — Other Ambulatory Visit: Payer: Self-pay

## 2020-06-14 DIAGNOSIS — C641 Malignant neoplasm of right kidney, except renal pelvis: Secondary | ICD-10-CM

## 2020-08-24 ENCOUNTER — Other Ambulatory Visit: Payer: Self-pay | Admitting: Interventional Radiology

## 2020-08-24 DIAGNOSIS — C641 Malignant neoplasm of right kidney, except renal pelvis: Secondary | ICD-10-CM

## 2020-08-25 ENCOUNTER — Other Ambulatory Visit: Payer: Self-pay

## 2020-08-25 DIAGNOSIS — C641 Malignant neoplasm of right kidney, except renal pelvis: Secondary | ICD-10-CM

## 2020-09-10 ENCOUNTER — Ambulatory Visit (HOSPITAL_COMMUNITY)
Admission: RE | Admit: 2020-09-10 | Discharge: 2020-09-10 | Disposition: A | Discharge status: Home / Self Care | Payer: Commercial Managed Care - PPO | Source: Ambulatory Visit | Attending: Interventional Radiology | Admitting: Interventional Radiology

## 2020-09-10 ENCOUNTER — Encounter (HOSPITAL_COMMUNITY): Payer: Self-pay

## 2020-09-10 ENCOUNTER — Other Ambulatory Visit: Payer: Self-pay

## 2020-09-10 DIAGNOSIS — C641 Malignant neoplasm of right kidney, except renal pelvis: Secondary | ICD-10-CM | POA: Diagnosis present

## 2020-09-10 LAB — POCT I-STAT CREATININE: Creatinine, Ser: 0.8 mg/dL (ref 0.61–1.24)

## 2020-09-10 MED ORDER — IOHEXOL 300 MG/ML  SOLN
100.0000 mL | Freq: Once | INTRAMUSCULAR | Status: AC | PRN
  Administered 2020-09-10: 09:00:00 100 mL via INTRAVENOUS

## 2020-09-10 MED ORDER — SODIUM CHLORIDE (PF) 0.9 % IJ SOLN
INTRAMUSCULAR | Status: AC
  Filled 2020-09-10: qty 50

## 2020-09-14 ENCOUNTER — Other Ambulatory Visit: Payer: Self-pay

## 2020-09-14 ENCOUNTER — Ambulatory Visit
Admission: RE | Admit: 2020-09-14 | Discharge: 2020-09-14 | Disposition: A | Discharge status: Home / Self Care | Payer: Commercial Managed Care - PPO | Source: Ambulatory Visit | Attending: Interventional Radiology | Admitting: Interventional Radiology

## 2020-09-14 ENCOUNTER — Encounter: Payer: Self-pay | Admitting: *Deleted

## 2020-09-14 DIAGNOSIS — C641 Malignant neoplasm of right kidney, except renal pelvis: Secondary | ICD-10-CM

## 2020-09-14 HISTORY — PX: IR RADIOLOGIST EVAL & MGMT: IMG5224

## 2020-09-14 NOTE — Progress Notes (Signed)
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  History of Present Illness: Marc Ellis is a 55 y.o. male status post cryoablation of a roughly 2 cm endophytic, interpolar right renal papillary carcinoma on 10/08/2019.  The procedure was uncomplicated. Mr. Zarcone has been doing well with no pain or urinary symptoms.  A follow-up CT was performed on 09/10/2020.  Past Medical History:  Diagnosis Date   Anxiety    Elevated PSA    Family history of prostate cancer    Hyperlipidemia    Hypertension    NO MEDS FOR HAS BEEN A LITTLE ELEVATED   Prostate cancer ([place]) JAN 2017   Renal mass     Past Surgical History:  Procedure Laterality Date   CHOLECYSTECTOMY     CYSTOSCOPY WITH STENT PLACEMENT Right 10/06/2019   Procedure: CYSTOSCOPY WITH STENT PLACEMENT;  Surgeon: Raynelle Bring, MD;  [facility];  Service: Urology;  Laterality: Right;   INGUINAL HERNIA REPAIR  1972   IR RADIOLOGIST EVAL & MGMT  09/16/2019   IR RADIOLOGIST EVAL & MGMT  11/11/2019   IR RADIOLOGIST EVAL & MGMT  01/07/2020   LYMPHADENECTOMY Bilateral 08/02/2015   Procedure: PELVIC LYMPHADENECTOMY;  Surgeon: Raynelle Bring, MD;  Location: [facility];  Service: Urology;  Laterality: Bilateral;   PROSTATE BIOPSY     RADIOLOGY WITH ANESTHESIA Right 10/08/2019   Procedure: RIGHT RENAL CRYO ABLATION;  Surgeon: Aletta Edouard, MD;  [facility];  Service: Radiology;  Laterality: Right;   ROBOT ASSISTED LAPAROSCOPIC RADICAL PROSTATECTOMY N/A 08/02/2015   Procedure: XI ROBOTIC ASSISTED LAPAROSCOPIC RADICAL PROSTATECTOMY LEVEL 2;  Surgeon: Raynelle Bring, MD;  Location[facility];  Service: Urology;  Laterality: N/A;    Allergies: Patient has no known allergies.  Medications: Prior to Admission medications   Medication Sig Start Date End Date Taking? Authorizing Provider  acetaminophen (TYLENOL) 500 MG tablet Take 1,000  mg by mouth every 6 (six) hours as needed (For pain.).    [provider]  atorvastatin (LIPITOR) 10 MG tablet Take 10 mg by mouth at bedtime.     [provider]  azelastine (ASTELIN) 0.1 % nasal spray Place 1 spray into both nostrils 2 (two) times daily as needed for allergies. Use in each nostril as directed    [provider]  bisoprolol-hydrochlorothiazide (ZIAC) 5-6.25 MG tablet Take 1 tablet by mouth daily. 06/13/19   [provider]  cetirizine (ZYRTEC) 10 MG tablet Take 10 mg by mouth daily.    [provider]  EPINEPHrine 0.3 mg/0.3 mL IJ SOAJ injection Inject 0.3 mg into the muscle once as needed (For anaphylaxis.).    [provider]  fluticasone (FLONASE) 50 MCG/ACT nasal spray Place 1 spray into both nostrils daily as needed for allergies.     [provider]  hydrOXYzine (VISTARIL) 25 MG capsule Take 50 mg by mouth at bedtime.  08/14/17   [provider]  LORazepam (ATIVAN) 1 MG tablet Take 0.5 mg by mouth 2 (two) times daily.     [provider]  Melatonin 10 MG CAPS Take 10 mg by mouth at bedtime as needed (sleep).     [provider]  montelukast (SINGULAIR) 10 MG tablet Take 10 mg by mouth at bedtime.  07/09/17   [provider]  naproxen sodium (ALEVE) 220 MG tablet Take 220 mg by mouth daily as needed (pain).    [provider]  nicotine (NICOTROL) 10 MG inhaler Inhale 1 continuous puffing into the lungs as needed for smoking cessation.    [provider]  pseudoephedrine-acetaminophen (TYLENOL SINUS) 30-500 MG TABS tablet Take 1 tablet by mouth every 4 (four) hours as needed (sinus headaches.).    [provider]  tadalafil (CIALIS) 5 MG tablet Take 5 mg by mouth at bedtime.  09/15/19   [provider]     Family History  Problem Relation Age of Onset   Prostate cancer Father 58   Prostate cancer Paternal Uncle        dx in his 7s   Kidney  failure Sister    Diabetes Maternal Aunt    Hypertension Maternal Grandmother    Prostate cancer Paternal Grandfather        dx in his 63s    Social History   Socioeconomic History   Marital status: Widowed    Spouse name: Not on file   Number of children: 3   Years of education: Not on file   Highest education level: Not on file  Occupational History   Not on file  Tobacco Use   Smoking status: Every Day    Packs/day: 1.00    Years: 20.00    Pack years: 20.00    Types: Cigarettes   Smokeless tobacco: Never  Vaping Use   Vaping Use: Never used  Substance and Sexual Activity   Alcohol use: No   Drug use: No   Sexual activity: Yes    Birth control/protection: None  Other Topics Concern   Not on file  Social History Narrative   Not on file   Social Determinants of Health   Financial Resource Strain: Not on file  Food Insecurity: Not on file  Transportation Needs: Not on file  Physical Activity: Not on file  Stress: Not on file  Social Connections: Not on file    ECOG Status: 0 - Asymptomatic  Review of Systems  Constitutional: Negative.   Respiratory: Negative.    Cardiovascular: Negative.   Genitourinary: Negative.   Musculoskeletal: Negative.   Neurological: Negative.    Review of Systems: A 12 point ROS discussed and pertinent positives are indicated in the HPI above.  All other systems are negative.  Physical Exam No direct physical exam was performed (except for noted visual exam findings with Video Visits).    Vital Signs: There were no vitals taken for this visit.  Imaging: CT ABDOMEN W WO CONTRAST  Result Date: 09/11/2020 CLINICAL DATA:  Follow-up renal carcinoma. Status post renal ablation 09/30/2019. EXAM: CT ABDOMEN WITHOUT AND WITH CONTRAST TECHNIQUE: Multidetector CT imaging of the abdomen was performed following the standard protocol before and following the bolus administration of intravenous contrast. CONTRAST:  169mL OMNIPAQUE IOHEXOL  300 MG/ML  SOLN COMPARISON:  None. FINDINGS: Lower chest:  Lung bases are clear. Hepatobiliary: No focal hepatic lesion. Postcholecystectomy. No biliary dilatation. Pancreas: Normal pancreatic parenchymal intensity. No ductal dilatation or inflammation. Spleen: Normal spleen. Adrenals/urinary tract: Adrenal glands normal. The ablation site in the mid RIGHT kidney has contracted in the interval measuring 1.7 x 1.6 cm (image 68/6) compared to 2.4 by 1.7 cm. No evidence of enhancement or nodularity at the ablation site. There are 2 low-density cystic lesions in the RIGHT kidney measuring 9 mm in the upper pole on image 53 series 11 and 3 mm lower pole image 83. These small low-density lesions are unchanged from prior most consistent benign cysts. . No enhancing LEFT  renal lesion. No retroperitoneal lymphadenopathy. Stomach/Bowel: Stomach and limited of the small bowel is unremarkable Vascular/Lymphatic: Abdominal aortic normal caliber. No retroperitoneal periportal lymphadenopathy. Musculoskeletal: No acute osseous abnormality. IMPRESSION: 1. No evidence of neoplasm recurrence at the ablation site in the mid RIGHT kidney. Ablation site has contracted in the interval. 2. Two benign-appearing cystic lesions in the RIGHT kidney are unchanged from prior. 3. No enhancing renal lesion identified. Electronically Signed   By: Suzy Bouchard M.D.   On: 09/11/2020 16:05    Labs:  CBC: Recent Labs    10/01/19 0942 10/08/19 0708  WBC 9.0 13.4*  HGB 15.3 15.1  HCT 46.3 44.4  PLT 259 237    COAGS: Recent Labs    10/08/19 0708  INR 0.9    BMP: Recent Labs    10/01/19 0942 10/08/19 0708 01/02/20 0958 09/10/20 0841  NA 139 139  --   --   K 4.4 3.9  --   --   CL 105 105  --   --   CO2 26 24  --   --   GLUCOSE 113* 100*  --   --   BUN 17 21*  --   --   CALCIUM 9.6 9.2  --   --   CREATININE 0.91 0.84 0.90 0.80  GFRNONAA >60 >60  --   --   GFRAA >60 >60  --   --      Assessment and Plan:  I  spoke with Mr. Tozzi by phone and reviewed imaging findings with him from the recent follow-up CT.  There is further contraction of the right renal ablation defect since prior imaging and no evidence of enhancement to suggest recurrent carcinoma.  I recommended a follow-up CT in 1 year.   Electronically Signed: Azzie Roup 09/14/2020, 1:24 PM    I spent a total of 10 Minutes in remote  clinical consultation, greater than 50% of which was counseling/coordinating care post ablation of a right renal carcinoma.    Visit type: Audio only (telephone). Audio (no video) only due to patient's lack of internet/smartphone capability. Alternative for in-person consultation at [facility], [address], [place], [place]. This visit type was conducted due to national recommendations for restrictions regarding the COVID-19 Pandemic (e.g. social distancing).  This format is felt to be most appropriate for this patient at this time.  All issues noted in this document were discussed and addressed.

## 2021-03-09 ENCOUNTER — Ambulatory Visit (HOSPITAL_COMMUNITY)
Admission: RE | Admit: 2021-03-09 | Discharge: 2021-03-09 | Disposition: A | Discharge status: Home / Self Care | Payer: Commercial Managed Care - PPO | Source: Ambulatory Visit | Attending: Urology | Admitting: Urology

## 2021-03-09 ENCOUNTER — Other Ambulatory Visit: Payer: Self-pay

## 2021-03-09 ENCOUNTER — Other Ambulatory Visit (HOSPITAL_COMMUNITY): Payer: Self-pay | Admitting: Urology

## 2021-03-09 DIAGNOSIS — C641 Malignant neoplasm of right kidney, except renal pelvis: Secondary | ICD-10-CM | POA: Diagnosis present

## 2021-08-19 ENCOUNTER — Other Ambulatory Visit: Payer: Self-pay | Admitting: Interventional Radiology

## 2021-08-19 DIAGNOSIS — C641 Malignant neoplasm of right kidney, except renal pelvis: Secondary | ICD-10-CM

## 2021-09-20 ENCOUNTER — Ambulatory Visit (HOSPITAL_COMMUNITY)
Admission: RE | Admit: 2021-09-20 | Discharge: 2021-09-20 | Disposition: A | Discharge status: Home / Self Care | Payer: BC Managed Care – PPO | Source: Ambulatory Visit | Attending: Interventional Radiology | Admitting: Interventional Radiology

## 2021-09-20 DIAGNOSIS — C641 Malignant neoplasm of right kidney, except renal pelvis: Secondary | ICD-10-CM | POA: Insufficient documentation

## 2021-09-20 LAB — POCT I-STAT CREATININE: Creatinine, Ser: 1.1 mg/dL (ref 0.61–1.24)

## 2021-09-20 MED ORDER — IOHEXOL 300 MG/ML  SOLN
100.0000 mL | Freq: Once | INTRAMUSCULAR | Status: AC | PRN
  Administered 2021-09-20: 100 mL via INTRAVENOUS

## 2021-09-20 MED ORDER — SODIUM CHLORIDE (PF) 0.9 % IJ SOLN
INTRAMUSCULAR | Status: AC
  Filled 2021-09-20: qty 50

## 2021-09-27 ENCOUNTER — Encounter: Payer: Self-pay | Admitting: *Deleted

## 2021-09-27 ENCOUNTER — Ambulatory Visit
Admission: RE | Admit: 2021-09-27 | Discharge: 2021-09-27 | Disposition: A | Discharge status: Home / Self Care | Payer: Self-pay | Source: Ambulatory Visit | Attending: Interventional Radiology | Admitting: Interventional Radiology

## 2021-09-27 DIAGNOSIS — C641 Malignant neoplasm of right kidney, except renal pelvis: Secondary | ICD-10-CM

## 2021-09-27 HISTORY — PX: IR RADIOLOGIST EVAL & MGMT: IMG5224

## 2021-09-27 NOTE — Progress Notes (Signed)
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  History of Present Illness: Marc Ellis is a 56 y.o. male status post cryoablation of a roughly 2 cm endophytic, interpolar right renal papillary carcinoma on 10/08/2019.  The procedure was uncomplicated. Marc Ellis has been doing well with no pain or urinary symptoms.  A follow-up CT was performed on 09/20/2021.  Past Medical History:  Diagnosis Date   Anxiety    Elevated PSA    Family history of prostate cancer    Hyperlipidemia    Hypertension    NO MEDS FOR HAS BEEN A LITTLE ELEVATED   Prostate cancer ([place]) JAN 2017   Renal mass     Past Surgical History:  Procedure Laterality Date   CHOLECYSTECTOMY     CYSTOSCOPY WITH STENT PLACEMENT Right 10/06/2019   Procedure: CYSTOSCOPY WITH STENT PLACEMENT;  Surgeon: Raynelle Bring, MD;  Location: [facility];  Service: Urology;  Laterality: Right;   INGUINAL HERNIA REPAIR  1972   IR RADIOLOGIST EVAL & MGMT  09/16/2019   IR RADIOLOGIST EVAL & MGMT  11/11/2019   IR RADIOLOGIST EVAL & MGMT  01/07/2020   IR RADIOLOGIST EVAL & MGMT  09/14/2020   LYMPHADENECTOMY Bilateral 08/02/2015   Procedure: PELVIC LYMPHADENECTOMY;  Surgeon: Raynelle Bring, MD;  Location: [facility];  Service: Urology;  Laterality: Bilateral;   PROSTATE BIOPSY     RADIOLOGY WITH ANESTHESIA Right 10/08/2019   Procedure: RIGHT RENAL CRYO ABLATION;  Surgeon: Aletta Edouard, MD;  Location[facility];  Service: Radiology;  Laterality: Right;   ROBOT ASSISTED LAPAROSCOPIC RADICAL PROSTATECTOMY N/A 08/02/2015   Procedure: XI ROBOTIC ASSISTED LAPAROSCOPIC RADICAL PROSTATECTOMY LEVEL 2;  Surgeon: Raynelle Bring, MD;  Location[facility];  Service: Urology;  Laterality: N/A;    Allergies: Patient has no known allergies.  Medications: Prior to Admission medications   Medication Sig Start Date End Date Taking? Authorizing Provider   acetaminophen (TYLENOL) 500 MG tablet Take 1,000 mg by mouth every 6 (six) hours as needed (For pain.).    [provider]  atorvastatin (LIPITOR) 10 MG tablet Take 10 mg by mouth at bedtime.     [provider]  azelastine (ASTELIN) 0.1 % nasal spray Place 1 spray into both nostrils 2 (two) times daily as needed for allergies. Use in each nostril as directed    [provider]  bisoprolol-hydrochlorothiazide (ZIAC) 5-6.25 MG tablet Take 1 tablet by mouth daily. 06/13/19   [provider]  cetirizine (ZYRTEC) 10 MG tablet Take 10 mg by mouth daily.    [provider]  EPINEPHrine 0.3 mg/0.3 mL IJ SOAJ injection Inject 0.3 mg into the muscle once as needed (For anaphylaxis.).    [provider]  fluticasone (FLONASE) 50 MCG/ACT nasal spray Place 1 spray into both nostrils daily as needed for allergies.     [provider]  hydrOXYzine (VISTARIL) 25 MG capsule Take 50 mg by mouth at bedtime.  08/14/17   [provider]  LORazepam (ATIVAN) 1 MG tablet Take 0.5 mg by mouth 2 (two) times daily.     [provider]  Melatonin 10 MG CAPS Take 10 mg by mouth at bedtime as needed (sleep).     [provider]  montelukast (SINGULAIR) 10 MG tablet Take 10 mg by mouth at bedtime.  07/09/17   [provider]  naproxen sodium (ALEVE) 220 MG tablet Take 220 mg  by mouth daily as needed (pain).    [provider]  nicotine (NICOTROL) 10 MG inhaler Inhale 1 continuous puffing into the lungs as needed for smoking cessation.    [provider]  pseudoephedrine-acetaminophen (TYLENOL SINUS) 30-500 MG TABS tablet Take 1 tablet by mouth every 4 (four) hours as needed (sinus headaches.).    [provider]  tadalafil (CIALIS) 5 MG tablet Take 5 mg by mouth at bedtime.  09/15/19   [provider]     Family History  Problem Relation Age of Onset   Prostate cancer Father 48   Prostate cancer  Paternal Uncle        dx in his 44s   Kidney failure Sister    Diabetes Maternal Aunt    Hypertension Maternal Grandmother    Prostate cancer Paternal Grandfather        dx in his 54s    Social History   Socioeconomic History   Marital status: Widowed    Spouse name: Not on file   Number of children: 3   Years of education: Not on file   Highest education level: Not on file  Occupational History   Not on file  Tobacco Use   Smoking status: Every Day    Packs/day: 1.00    Years: 20.00    Total pack years: 20.00    Types: Cigarettes   Smokeless tobacco: Never  Vaping Use   Vaping Use: Never used  Substance and Sexual Activity   Alcohol use: No   Drug use: No   Sexual activity: Yes    Birth control/protection: None  Other Topics Concern   Not on file  Social History Narrative   Not on file   Social Determinants of Health   Financial Resource Strain: Not on file  Food Insecurity: Not on file  Transportation Needs: Not on file  Physical Activity: Not on file  Stress: Not on file  Social Connections: Not on file    ECOG Status: 0 - Asymptomatic  Review of Systems  Constitutional: Negative.   Respiratory: Negative.    Cardiovascular: Negative.   Gastrointestinal: Negative.   Genitourinary: Negative.   Musculoskeletal: Negative.   Neurological: Negative.     Review of Systems: A 12 point ROS discussed and pertinent positives are indicated in the HPI above.  All other systems are negative.    Physical Exam No direct physical exam was performed (except for noted visual exam findings with Video Visits).   Vital Signs: There were no vitals taken for this visit.  Imaging: CT ABDOMEN W WO CONTRAST  Result Date: 09/21/2021 CLINICAL DATA:  Cryoablation for interpolar right renal papillary carcinoma on 10/08/2019. * Tracking Code: BO * EXAM: CT ABDOMEN WITHOUT AND WITH CONTRAST TECHNIQUE: Multidetector CT imaging of the abdomen was performed following the  standard protocol before and following the bolus administration of intravenous contrast. RADIATION DOSE REDUCTION: This exam was performed according to the departmental dose-optimization program which includes automated exposure control, adjustment of the mA and/or kV according to patient size and/or use of iterative reconstruction technique. CONTRAST:  153m OMNIPAQUE IOHEXOL 300 MG/ML  SOLN COMPARISON:  09/10/2020, 01/02/2020. FINDINGS: Lower chest: Minimal scattered scarring in the lung bases. Lung bases are otherwise clear. Coronary artery calcification. Heart is at the upper limits of normal in size. Left ventricle appears dilated. No pericardial or pleural effusion. Distal esophagus is unremarkable. Hepatobiliary: No arterial phase enhancement in the liver. Liver is slightly enlarged, 18.2 cm but is otherwise  unremarkable. Cholecystectomy. No biliary ductal dilatation. Pancreas: Elongated uncinate process, unchanged. Otherwise negative. Spleen: Negative. Adrenals/Urinary Tract: Adrenal glands are unremarkable. Low-attenuation lesions in the kidneys measure up to 13 mm in the upper pole and are likely cysts. No follow-up necessary. Low-attenuation cryoablation defect in the interpolar right kidney measures 1.5 x 1.8 cm, stable from 09/10/2020 and decreased in size from 1.6 x 2.1 cm on 01/02/2020. No associated enhancement. Kidneys are otherwise unremarkable. Stomach/Bowel: Stomach and visualized portions of the small bowel, appendix and colon are unremarkable. Vascular/Lymphatic: Atherosclerotic calcification of the aorta. No pathologically enlarged lymph nodes. Other: No free fluid. Mild haziness and subcentimeter nodularity in the small bowel mesentery, unchanged. Mesenteries and peritoneum are otherwise unremarkable. Tiny umbilical hernia contains fat. Musculoskeletal: Degenerative changes in the spine. Bone island in L4. No worrisome lytic or sclerotic lesions. IMPRESSION: 1. Stable cryoablation defect in  the interpolar right kidney. No evidence recurrent or metastatic disease. 2. Mild hepatomegaly. 3. Aortic atherosclerosis (ICD10-I70.0). Coronary artery calcification. Left ventricle appears dilated. Electronically Signed   By: Lorin Picket M.D.   On: 09/21/2021 10:03    Labs:  CBC: No results for input(s): "WBC", "HGB", "HCT", "PLT" in the last 8760 hours.  COAGS: No results for input(s): "INR", "APTT" in the last 8760 hours.  BMP: Recent Labs    09/20/21 0844  CREATININE 1.10     Assessment and Plan:  I spoke with Mr. Kimberling over the phone.  We reviewed findings from the follow-up CT dated 09/20/2021.  This demonstrates further retraction of an interpolar scar of the right kidney with no enhancement to suggest recurrent carcinoma.  He is now 2 years post ablation.  I recommended a follow-up multiphase CT in 1 year.  I will meet with him after the next follow-up CT.   Electronically Signed: Azzie Roup 09/27/2021, 9:16 AM    I spent a total of  10 Minutes in remote  clinical consultation, greater than 50% of which was counseling/coordinating care post cryoablation of a biopsy-proven right papillary renal carcinoma.    Visit type: Audio only (telephone). Audio (no video) only due to patient's lack of internet/smartphone capability. Alternative for in-person consultation at [facility], [place] [address], [place], [place]. This visit type was conducted due to national recommendations for restrictions regarding the COVID-19 Pandemic (e.g. social distancing).  This format is felt to be most appropriate for this patient at this time.  All issues noted in this document were discussed and addressed.

## 2022-10-26 ENCOUNTER — Other Ambulatory Visit: Payer: Self-pay | Admitting: Interventional Radiology

## 2022-10-26 DIAGNOSIS — C801 Malignant (primary) neoplasm, unspecified: Secondary | ICD-10-CM

## 2022-12-08 ENCOUNTER — Ambulatory Visit
Admission: RE | Admit: 2022-12-08 | Discharge: 2022-12-08 | Disposition: A | Discharge status: Home / Self Care | Payer: BC Managed Care – PPO | Source: Ambulatory Visit | Attending: Interventional Radiology | Admitting: Interventional Radiology

## 2022-12-08 DIAGNOSIS — C801 Malignant (primary) neoplasm, unspecified: Secondary | ICD-10-CM

## 2022-12-08 MED ORDER — IOPAMIDOL (ISOVUE-300) INJECTION 61%
200.0000 mL | Freq: Once | INTRAVENOUS | Status: AC | PRN
  Administered 2022-12-08: 100 mL via INTRAVENOUS

## 2022-12-29 ENCOUNTER — Ambulatory Visit
Admission: RE | Admit: 2022-12-29 | Discharge: 2022-12-29 | Disposition: A | Discharge status: Home / Self Care | Payer: BC Managed Care – PPO | Source: Ambulatory Visit | Attending: Interventional Radiology | Admitting: Interventional Radiology

## 2022-12-29 DIAGNOSIS — C801 Malignant (primary) neoplasm, unspecified: Secondary | ICD-10-CM

## 2022-12-29 HISTORY — PX: IR RADIOLOGIST EVAL & MGMT: IMG5224

## 2022-12-29 NOTE — Progress Notes (Signed)
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   History of Present Illness: Marc Ellis is a 57 y.o. male post cryoablation of a roughly 2 cm endophytic, interpolar right renal papillary carcinoma on 10/08/2019.  The procedure was uncomplicated. Marc Ellis has been doing well with no pain or urinary symptoms.  A follow-up CT was performed on 12/08/22.  Past Medical History:  Diagnosis Date   Anxiety    Elevated PSA    Family history of prostate cancer    Hyperlipidemia    Hypertension    NO MEDS FOR HAS BEEN A LITTLE ELEVATED   Prostate cancer ([facility]) JAN 2017   Renal mass     Past Surgical History:  Procedure Laterality Date   CHOLECYSTECTOMY     CYSTOSCOPY WITH STENT PLACEMENT Right 10/06/2019   Procedure: CYSTOSCOPY WITH STENT PLACEMENT;  Surgeon: Heloise Purpura, MD;  [facility];  Service: Urology;  Laterality: Right;   INGUINAL HERNIA REPAIR  1972   IR RADIOLOGIST EVAL & MGMT  09/16/2019   IR RADIOLOGIST EVAL & MGMT  11/11/2019   IR RADIOLOGIST EVAL & MGMT  01/07/2020   IR RADIOLOGIST EVAL & MGMT  09/14/2020   IR RADIOLOGIST EVAL & MGMT  09/27/2021   LYMPHADENECTOMY Bilateral 08/02/2015   Procedure: PELVIC LYMPHADENECTOMY;  Surgeon: Heloise Purpura, MD;  [facility];  Service: Urology;  Laterality: Bilateral;   PROSTATE BIOPSY     RADIOLOGY WITH ANESTHESIA Right 10/08/2019   Procedure: RIGHT RENAL CRYO ABLATION;  Surgeon: Irish Lack, MD;  [facility];  Service: Radiology;  Laterality: Right;   ROBOT ASSISTED LAPAROSCOPIC RADICAL PROSTATECTOMY N/A 08/02/2015   Procedure: XI ROBOTIC ASSISTED LAPAROSCOPIC RADICAL PROSTATECTOMY LEVEL 2;  Surgeon: Heloise Purpura, MD;  Location[facility];  Service: Urology;  Laterality: N/A;    Allergies: Patient has no known allergies.  Medications: Prior to Admission medications   Medication Sig Start Date End Date Taking?  Authorizing Provider  acetaminophen (TYLENOL) 500 MG tablet Take 1,000 mg by mouth every 6 (six) hours as needed (For pain.).    [provider]  atorvastatin (LIPITOR) 10 MG tablet Take 10 mg by mouth at bedtime.     [provider]  azelastine (ASTELIN) 0.1 % nasal spray Place 1 spray into both nostrils 2 (two) times daily as needed for allergies. Use in each nostril as directed    [provider]  bisoprolol-hydrochlorothiazide (ZIAC) 5-6.25 MG tablet Take 1 tablet by mouth daily. 06/13/19   [provider]  cetirizine (ZYRTEC) 10 MG tablet Take 10 mg by mouth daily.    [provider]  EPINEPHrine 0.3 mg/0.3 mL IJ SOAJ injection Inject 0.3 mg into the muscle once as needed (For anaphylaxis.).    [provider]  fluticasone (FLONASE) 50 MCG/ACT nasal spray Place 1 spray into both nostrils daily as needed for allergies.     [provider]  hydrOXYzine (VISTARIL) 25 MG capsule Take 50 mg by mouth at bedtime.  08/14/17   [provider]  LORazepam (ATIVAN) 1 MG tablet Take 0.5 mg by mouth 2 (two) times daily.     [provider]  Melatonin 10 MG CAPS Take 10 mg by mouth at bedtime as needed (sleep).     [provider]  montelukast (SINGULAIR) 10 MG tablet Take 10 mg by mouth at bedtime.  07/09/17   [provider]  naproxen  sodium (ALEVE) 220 MG tablet Take 220 mg by mouth daily as needed (pain).    [provider]  nicotine (NICOTROL) 10 MG inhaler Inhale 1 continuous puffing into the lungs as needed for smoking cessation.    [provider]  pseudoephedrine-acetaminophen (TYLENOL SINUS) 30-500 MG TABS tablet Take 1 tablet by mouth every 4 (four) hours as needed (sinus headaches.).    [provider]  tadalafil (CIALIS) 5 MG tablet Take 5 mg by mouth at bedtime.  09/15/19   [provider]     Family History  Problem Relation Age of Onset   Prostate cancer Father 9    Prostate cancer Paternal Uncle        dx in his 58s   Kidney failure Sister    Diabetes Maternal Aunt    Hypertension Maternal Grandmother    Prostate cancer Paternal Grandfather        dx in his 54s    Social History   Socioeconomic History   Marital status: Widowed    Spouse name: Not on file   Number of children: 3   Years of education: Not on file   Highest education level: Not on file  Occupational History   Not on file  Tobacco Use   Smoking status: Every Day    Current packs/day: 1.00    Average packs/day: 1 pack/day for 20.0 years (20.0 ttl pk-yrs)    Types: Cigarettes   Smokeless tobacco: Never  Vaping Use   Vaping status: Never Used  Substance and Sexual Activity   Alcohol use: No   Drug use: No   Sexual activity: Yes    Birth control/protection: None  Other Topics Concern   Not on file  Social History Narrative   Not on file   Social Determinants of Health   Financial Resource Strain: Low Risk  (05/02/2022)   [facility]   Overall Financial Resource Strain (CARDIA)    Difficulty of Paying Living Expenses: Not hard at all  Food Insecurity: No Food Insecurity (05/02/2022)   Received from [facility]   Hunger Vital Sign    Worried About Running Out of Food in the Last Year: Never true    Ran Out of Food in the Last Year: Never true  Transportation Needs: No Transportation Needs (05/02/2022)   Received from [facility] - Transportation    Lack of Transportation (Medical): No    Lack of Transportation (Non-Medical): No  Physical Activity: Sufficiently Active (10/06/2021)   Received from [facility]   Exercise Vital Sign    Days of Exercise per Week: 6 days    Minutes of Exercise per Session: 100 min  Stress: No Stress Concern Present (10/06/2021)   Received from [facility] - Occupational Stress Questionnaire    Feeling of Stress : Only a little  Social Connections: Unknown  (10/07/2022)   Received from [facility]   Social Network    Social Network: Not on file    ECOG Status: 0 - Asymptomatic  Review of Systems  Constitutional: Negative.   Respiratory: Negative.    Cardiovascular: Negative.   Gastrointestinal: Negative.   Genitourinary: Negative.   Musculoskeletal: Negative.   Neurological: Negative.     Review of Systems: A 12 point ROS discussed and pertinent positives are indicated in the HPI above.  All other systems are negative.   Physical Exam No direct physical exam was performed (except for noted  visual exam findings with Video Visits).   Vital Signs: There were no vitals taken for this visit.  Imaging: CT ABDOMEN W WO CONTRAST  Result Date: 12/24/2022 CLINICAL DATA:  History of right renal papillary carcinoma status post cryoablation 10/08/2019 and prostate cancer status post XRT. EXAM: CT ABDOMEN WITHOUT AND WITH CONTRAST TECHNIQUE: Multidetector CT imaging of the abdomen was performed following the standard protocol before and following the bolus administration of intravenous contrast. RADIATION DOSE REDUCTION: This exam was performed according to the departmental dose-optimization program which includes automated exposure control, adjustment of the mA and/or kV according to patient size and/or use of iterative reconstruction technique. CONTRAST:  ISOVUE-300 IOPAMIDOL (ISOVUE-300) INJECTION 61% COMPARISON:  CT abdomen dated 09/20/2021 FINDINGS: Lower chest: No focal consolidation or pulmonary nodule in the lung bases. No pleural effusion or pneumothorax demonstrated. Partially imaged heart size is normal. Coronary artery calcifications. Hepatobiliary: No focal hepatic lesions. No intra or extrahepatic biliary ductal dilation. Cholecystectomy. Pancreas: No focal lesions or main ductal dilation. Spleen: Normal in size without focal abnormality. Adrenals/Urinary Tract: No adrenal nodules. Continued contraction of posttreatment changes  along the posterolateral right interpolar kidney. No suspicious renal masses, calculi or hydronephrosis. 1.2 cm right upper pole simple cyst. Additional subcentimeter hypodensities, too small to characterize no specific follow-up imaging recommended. Stomach/Bowel: Normal appearance of the stomach. No evidence of bowel wall thickening, distention, or inflammatory changes. Vascular/Lymphatic: Aortic atherosclerosis. No enlarged abdominal lymph nodes. Other: No free fluid, fluid collection, or free air. Musculoskeletal: No acute or abnormal lytic or blastic osseous lesions. Unchanged subcentimeter sclerotic focus along the left vertebral body of L4. IMPRESSION: 1. Continued contraction of posttreatment changes along the posterolateral right interpolar kidney. No evidence of recurrent or metastatic disease in the abdomen. 2. Aortic Atherosclerosis (ICD10-I70.0). Coronary artery calcifications. Assessment for potential risk factor modification, dietary therapy or pharmacologic therapy may be warranted, if clinically indicated. Electronically Signed   By: Agustin Cree M.D.   On: 12/24/2022 08:43     Assessment and Plan:  I spoke with Marc Ellis by phone. The follow up CT on 12/08/22 demonstrates further retraction of the cryoablation defect along the posterior interpolar right kidney with only a small non-enhancing scar remaining. There is no evidence of recurrent carcinoma. He is now just over 3 years post ablation. I recommended another follow up CT in one year.   Electronically Signed: Reola Calkins 12/29/2022, 9:01 AM    I spent a total of 10 Minutes in remote  clinical consultation, greater than 50% of which was counseling/coordinating care post ablation of a right renal carcinoma.    Visit type: Audio only (telephone). Audio (no video) only due to patient's lack of internet/smartphone capability. Alternative for in-person consultation at [facility], [address], [place],  [place]. This visit type was conducted due to national recommendations for restrictions regarding the COVID-19 Pandemic (e.g. social distancing).  This format is felt to be most appropriate for this patient at this time.  All issues noted in this document were discussed and addressed.

## 2023-01-02 ENCOUNTER — Other Ambulatory Visit: Payer: Self-pay | Admitting: Urology

## 2023-01-02 ENCOUNTER — Ambulatory Visit
Admission: RE | Admit: 2023-01-02 | Discharge: 2023-01-02 | Disposition: A | Discharge status: Home / Self Care | Payer: BC Managed Care – PPO | Source: Ambulatory Visit | Attending: Urology | Admitting: Urology

## 2023-01-02 DIAGNOSIS — C641 Malignant neoplasm of right kidney, except renal pelvis: Secondary | ICD-10-CM

## 2023-10-01 NOTE — Progress Notes (Signed)
  Impression and Plan   1. Essential hypertension (Primary) -controlled on Ziac, continue with working on smoking cessation 2. Pure hypercholesterolemia -continue with healthy dietary regimen and urged to be more consistent with statin use since recent LDL had jumped up -     Comprehensive Metabolic Panel; Future -     Lipid Panel With LDL/HDL Ratio; Future 3. Generalized anxiety disorder -using lorazepam  sparingly and hydroxyzine as needed 4. Cigarette nicotine dependence without complication -urged complete smoking cessation  5. History of prostate cancer -quiescent, monitored by urology 6. History of renal cell carcinoma -quiescent monitored by urology    Risks, benefits, and alternatives of the medications and treatment plan prescribed today were discussed, and patient expressed understanding. Plan follow-up as discussed or as needed if any worsening symptoms or change in condition.    A yearly preventative health exam was recommended and current age based recommendations were discussed.     Subjective      Patient presents with  . Hypertension  . Hyperlipidemia    Hypertension Patient compliant with BP meds and sticking to low sodium diet most of the time. Denies headache, dizziness, CP, SOB, edema, palpitations. Tries to stay active and maintain proper weight. Has been getting on a 2 mile walking regimen.  Hyperlipidemia Since last OV has been much more compliant with diet and statin use. Denies side effects, specifically myalgias and GI sxs. Compliant with monitoring lipid studies.  Anxiety Anxiety symptoms have been well controlled. Rare breakthrough sxs are controlled with medication and patient denies recent CP, SOB, palpitations, dizziness, panic attacks, excessive worrying, and changes in appetite and sleep quality. Patient denies other mood changes and associated depression symptoms.   Hx of prostate and renal cell carcinoma No new GU symptoms.  Followed by urology has  rare nocturia and denies dribbling, hesitancy, dysuria, frequency, hematuria, urgency. Denies low back pain, fever, chills. Compliant with medication and denies side effects.     Reviewed and updated this visit by provider: Tobacco  Allergies  Meds  Problems  Med Hx  Surg Hx  Fam Hx       ROS:  Pertinent review of systems is negative except as noted.     Objective   Vitals:   10/01/23 0733  BP: 118/78  Patient Position: Sitting  Pulse: 61  Temp: 97.6 F (36.4 C)  TempSrc: Temporal  Weight: 226 lb (102.5 kg)  PainSc: 0-No pain    General:  Well developed, Well nourished, No distress HEENT: Normocephalic, atraumatic Neck:  Supple with normal range of motion; No lymphadenopathy  CV:  S1S2, Regular rate and rhythm, without murmur, gallops or rubs Lungs:  Clear to auscultation bilaterally with normal effort Skin:  No focal rashes  Extremities:  No clubbing, cyanosis or edema.   Neuro:  No focal deficits

## 2023-12-28 ENCOUNTER — Other Ambulatory Visit: Payer: Self-pay | Admitting: Interventional Radiology

## 2023-12-28 DIAGNOSIS — C641 Malignant neoplasm of right kidney, except renal pelvis: Secondary | ICD-10-CM

## 2024-04-11 NOTE — Progress Notes (Signed)
"  °  Impression and Plan   1. Essential hypertension (Primary) - stable on Ziac, urged weight loss 2. Pure hypercholesterolemia - stable on Lipitor 3. Generalized anxiety disorder - doing great trying to come off lorazepam  4. Cigarette nicotine dependence without complication - urged complete cessation 5. History of prostate cancer - in remission 6. History of renal cell carcinoma - in remission   Risks, benefits, and alternatives of the medications and treatment plan prescribed today were discussed, and patient expressed understanding. Plan follow-up as discussed or as needed if any worsening symptoms or change in condition.    A yearly preventative health exam was recommended and current age based recommendations were discussed.     Subjective      Patient presents with   Hypertension    Hypertension Patient compliant with BP meds and sticking to low sodium diet most of the time. Denies headache, dizziness, CP, SOB, edema, palpitations. Tries to stay active and maintain proper weight. Denies routine exercise.  Hyperlipidemia Compliant with diet and statin use. Denies side effects, specifically myalgias and GI sxs. Compliant with monitoring lipid studies.  Anxiety Anxiety symptoms have been well controlled. Rare breakthrough sxs are controlled with medication and patient denies recent CP, SOB, palpitations, dizziness, panic attacks, excessive worrying, and changes in appetite and sleep quality. Patient denies other mood changes and associated depression symptoms.   Hx of renal cell carcinoma and prostate cancer Well controlled on current medication regimen. Has rare nocturia and denies dribbling, hesitancy, dysuria, frequency, hematuria, urgency. Denies low back pain, fever, chills. Compliant with medication and denies side effects. Followed closely by urology.   Reviewed and updated this visit by provider: None       ROS:  Pertinent review of systems is negative except as  noted.     Objective   Vitals:   04/11/24 0745  BP: 134/74  Patient Position: Sitting  Pulse: 63  Temp: 97 F (36.1 C)  TempSrc: Temporal  Height: 5' 10 (1.778 m)  Weight: 238 lb 4 oz (108.1 kg)  BMI (Calculated): 34.2  PainSc: 0-No pain    General:  Well developed, Well nourished, overweight. No distress HEENT: Normocephalic, atraumatic Neck:  Supple with normal range of motion; No lymphadenopathy  CV:  S1S2, Regular rate and rhythm, without murmur, gallops or rubs Lungs:  Clear to auscultation bilaterally with normal effort Skin:  No focal rashes  Extremities:  No clubbing, cyanosis or edema.   Neuro:  No focal deficits    "
